# Patient Record
Sex: Female | Born: 1975 | Race: Black or African American | Hispanic: No | Marital: Single | Smoking: Current every day smoker
Health system: Southern US, Community
[De-identification: ages and names within clinical notes are randomized; demographics above are authoritative.]

## PROBLEM LIST (undated history)

## (undated) DIAGNOSIS — E049 Nontoxic goiter, unspecified: Secondary | ICD-10-CM

## (undated) DIAGNOSIS — E785 Hyperlipidemia, unspecified: Secondary | ICD-10-CM

## (undated) DIAGNOSIS — E119 Type 2 diabetes mellitus without complications: Secondary | ICD-10-CM

## (undated) DIAGNOSIS — K219 Gastro-esophageal reflux disease without esophagitis: Secondary | ICD-10-CM

## (undated) DIAGNOSIS — E569 Vitamin deficiency, unspecified: Secondary | ICD-10-CM

## (undated) HISTORY — PX: TUBAL LIGATION: SHX77

## (undated) HISTORY — PX: CERVICAL CERCLAGE: SHX1329

## (undated) HISTORY — PX: COLONOSCOPY: SHX174

---

## 2010-12-29 DIAGNOSIS — Z8742 Personal history of other diseases of the female genital tract: Secondary | ICD-10-CM | POA: Insufficient documentation

## 2010-12-29 DIAGNOSIS — E559 Vitamin D deficiency, unspecified: Secondary | ICD-10-CM | POA: Insufficient documentation

## 2010-12-29 DIAGNOSIS — E669 Obesity, unspecified: Secondary | ICD-10-CM | POA: Insufficient documentation

## 2010-12-29 DIAGNOSIS — E785 Hyperlipidemia, unspecified: Secondary | ICD-10-CM | POA: Insufficient documentation

## 2010-12-29 DIAGNOSIS — A6009 Herpesviral infection of other urogenital tract: Secondary | ICD-10-CM | POA: Insufficient documentation

## 2010-12-29 DIAGNOSIS — E042 Nontoxic multinodular goiter: Secondary | ICD-10-CM | POA: Insufficient documentation

## 2010-12-29 DIAGNOSIS — K219 Gastro-esophageal reflux disease without esophagitis: Secondary | ICD-10-CM | POA: Insufficient documentation

## 2010-12-29 DIAGNOSIS — Z8709 Personal history of other diseases of the respiratory system: Secondary | ICD-10-CM | POA: Insufficient documentation

## 2010-12-29 DIAGNOSIS — E1165 Type 2 diabetes mellitus with hyperglycemia: Secondary | ICD-10-CM | POA: Insufficient documentation

## 2014-02-23 ENCOUNTER — Emergency Department: Payer: Self-pay | Admitting: Internal Medicine

## 2015-07-14 DIAGNOSIS — Z720 Tobacco use: Secondary | ICD-10-CM | POA: Insufficient documentation

## 2015-11-13 DIAGNOSIS — E1165 Type 2 diabetes mellitus with hyperglycemia: Secondary | ICD-10-CM | POA: Diagnosis not present

## 2015-11-13 DIAGNOSIS — Z794 Long term (current) use of insulin: Secondary | ICD-10-CM | POA: Diagnosis not present

## 2015-11-17 DIAGNOSIS — E8881 Metabolic syndrome: Secondary | ICD-10-CM | POA: Diagnosis not present

## 2015-11-17 DIAGNOSIS — E785 Hyperlipidemia, unspecified: Secondary | ICD-10-CM | POA: Diagnosis not present

## 2015-11-17 DIAGNOSIS — E1165 Type 2 diabetes mellitus with hyperglycemia: Secondary | ICD-10-CM | POA: Diagnosis not present

## 2015-11-17 DIAGNOSIS — E559 Vitamin D deficiency, unspecified: Secondary | ICD-10-CM | POA: Diagnosis not present

## 2015-12-16 DIAGNOSIS — E559 Vitamin D deficiency, unspecified: Secondary | ICD-10-CM | POA: Diagnosis not present

## 2015-12-16 DIAGNOSIS — E785 Hyperlipidemia, unspecified: Secondary | ICD-10-CM | POA: Diagnosis not present

## 2015-12-16 DIAGNOSIS — E1165 Type 2 diabetes mellitus with hyperglycemia: Secondary | ICD-10-CM | POA: Diagnosis not present

## 2015-12-16 DIAGNOSIS — E8881 Metabolic syndrome: Secondary | ICD-10-CM | POA: Diagnosis not present

## 2016-03-02 DIAGNOSIS — Z23 Encounter for immunization: Secondary | ICD-10-CM | POA: Diagnosis not present

## 2016-04-20 DIAGNOSIS — E1165 Type 2 diabetes mellitus with hyperglycemia: Secondary | ICD-10-CM | POA: Diagnosis not present

## 2016-04-20 DIAGNOSIS — E785 Hyperlipidemia, unspecified: Secondary | ICD-10-CM | POA: Diagnosis not present

## 2016-04-20 DIAGNOSIS — E559 Vitamin D deficiency, unspecified: Secondary | ICD-10-CM | POA: Diagnosis not present

## 2016-04-20 DIAGNOSIS — Z794 Long term (current) use of insulin: Secondary | ICD-10-CM | POA: Diagnosis not present

## 2016-04-22 DIAGNOSIS — E1165 Type 2 diabetes mellitus with hyperglycemia: Secondary | ICD-10-CM | POA: Diagnosis not present

## 2016-04-22 DIAGNOSIS — E785 Hyperlipidemia, unspecified: Secondary | ICD-10-CM | POA: Diagnosis not present

## 2016-04-22 DIAGNOSIS — E8881 Metabolic syndrome: Secondary | ICD-10-CM | POA: Diagnosis not present

## 2016-04-22 DIAGNOSIS — E559 Vitamin D deficiency, unspecified: Secondary | ICD-10-CM | POA: Diagnosis not present

## 2016-09-23 DIAGNOSIS — Z23 Encounter for immunization: Secondary | ICD-10-CM | POA: Diagnosis not present

## 2016-09-23 DIAGNOSIS — E042 Nontoxic multinodular goiter: Secondary | ICD-10-CM | POA: Diagnosis not present

## 2016-09-23 DIAGNOSIS — Z794 Long term (current) use of insulin: Secondary | ICD-10-CM | POA: Diagnosis not present

## 2016-09-23 DIAGNOSIS — Z1231 Encounter for screening mammogram for malignant neoplasm of breast: Secondary | ICD-10-CM | POA: Diagnosis not present

## 2016-09-23 DIAGNOSIS — E1165 Type 2 diabetes mellitus with hyperglycemia: Secondary | ICD-10-CM | POA: Diagnosis not present

## 2016-09-23 DIAGNOSIS — Z0000 Encounter for general adult medical examination without abnormal findings: Secondary | ICD-10-CM | POA: Diagnosis not present

## 2016-11-08 ENCOUNTER — Other Ambulatory Visit: Payer: Self-pay | Admitting: Obstetrics & Gynecology

## 2016-11-08 DIAGNOSIS — Z01419 Encounter for gynecological examination (general) (routine) without abnormal findings: Secondary | ICD-10-CM | POA: Diagnosis not present

## 2016-11-08 DIAGNOSIS — Z1231 Encounter for screening mammogram for malignant neoplasm of breast: Secondary | ICD-10-CM

## 2016-11-08 DIAGNOSIS — Z87898 Personal history of other specified conditions: Secondary | ICD-10-CM | POA: Diagnosis not present

## 2017-01-04 DIAGNOSIS — Z23 Encounter for immunization: Secondary | ICD-10-CM | POA: Diagnosis not present

## 2017-01-04 DIAGNOSIS — J029 Acute pharyngitis, unspecified: Secondary | ICD-10-CM | POA: Diagnosis not present

## 2017-01-04 DIAGNOSIS — H6122 Impacted cerumen, left ear: Secondary | ICD-10-CM | POA: Diagnosis not present

## 2017-01-06 DIAGNOSIS — H6122 Impacted cerumen, left ear: Secondary | ICD-10-CM | POA: Diagnosis not present

## 2017-01-21 DIAGNOSIS — E1165 Type 2 diabetes mellitus with hyperglycemia: Secondary | ICD-10-CM | POA: Diagnosis not present

## 2017-01-21 DIAGNOSIS — Z794 Long term (current) use of insulin: Secondary | ICD-10-CM | POA: Diagnosis not present

## 2017-01-25 DIAGNOSIS — E785 Hyperlipidemia, unspecified: Secondary | ICD-10-CM | POA: Diagnosis not present

## 2017-01-25 DIAGNOSIS — E1165 Type 2 diabetes mellitus with hyperglycemia: Secondary | ICD-10-CM | POA: Diagnosis not present

## 2017-01-25 DIAGNOSIS — E8881 Metabolic syndrome: Secondary | ICD-10-CM | POA: Diagnosis not present

## 2017-01-25 DIAGNOSIS — E559 Vitamin D deficiency, unspecified: Secondary | ICD-10-CM | POA: Diagnosis not present

## 2017-02-25 DIAGNOSIS — E559 Vitamin D deficiency, unspecified: Secondary | ICD-10-CM | POA: Diagnosis not present

## 2017-02-25 DIAGNOSIS — E8881 Metabolic syndrome: Secondary | ICD-10-CM | POA: Diagnosis not present

## 2017-02-25 DIAGNOSIS — E1165 Type 2 diabetes mellitus with hyperglycemia: Secondary | ICD-10-CM | POA: Diagnosis not present

## 2017-02-25 DIAGNOSIS — E042 Nontoxic multinodular goiter: Secondary | ICD-10-CM | POA: Diagnosis not present

## 2017-03-03 DIAGNOSIS — R399 Unspecified symptoms and signs involving the genitourinary system: Secondary | ICD-10-CM | POA: Diagnosis not present

## 2017-03-03 DIAGNOSIS — Z789 Other specified health status: Secondary | ICD-10-CM | POA: Diagnosis not present

## 2017-03-03 DIAGNOSIS — Z23 Encounter for immunization: Secondary | ICD-10-CM | POA: Diagnosis not present

## 2017-05-13 DIAGNOSIS — R748 Abnormal levels of other serum enzymes: Secondary | ICD-10-CM | POA: Diagnosis not present

## 2017-05-13 DIAGNOSIS — E559 Vitamin D deficiency, unspecified: Secondary | ICD-10-CM | POA: Diagnosis not present

## 2017-05-13 DIAGNOSIS — R0789 Other chest pain: Secondary | ICD-10-CM | POA: Diagnosis not present

## 2017-05-13 DIAGNOSIS — E1165 Type 2 diabetes mellitus with hyperglycemia: Secondary | ICD-10-CM | POA: Diagnosis not present

## 2017-05-13 DIAGNOSIS — Z794 Long term (current) use of insulin: Secondary | ICD-10-CM | POA: Diagnosis not present

## 2017-05-13 DIAGNOSIS — R809 Proteinuria, unspecified: Secondary | ICD-10-CM | POA: Diagnosis not present

## 2017-05-19 DIAGNOSIS — Z720 Tobacco use: Secondary | ICD-10-CM | POA: Diagnosis not present

## 2017-05-19 DIAGNOSIS — E1165 Type 2 diabetes mellitus with hyperglycemia: Secondary | ICD-10-CM | POA: Diagnosis not present

## 2017-05-19 DIAGNOSIS — R079 Chest pain, unspecified: Secondary | ICD-10-CM | POA: Diagnosis not present

## 2017-05-19 DIAGNOSIS — E785 Hyperlipidemia, unspecified: Secondary | ICD-10-CM | POA: Diagnosis not present

## 2017-05-25 DIAGNOSIS — R748 Abnormal levels of other serum enzymes: Secondary | ICD-10-CM | POA: Diagnosis not present

## 2017-05-25 DIAGNOSIS — N1330 Unspecified hydronephrosis: Secondary | ICD-10-CM | POA: Diagnosis not present

## 2017-06-01 DIAGNOSIS — R079 Chest pain, unspecified: Secondary | ICD-10-CM | POA: Diagnosis not present

## 2017-06-03 DIAGNOSIS — E1169 Type 2 diabetes mellitus with other specified complication: Secondary | ICD-10-CM | POA: Diagnosis not present

## 2017-06-03 DIAGNOSIS — Z794 Long term (current) use of insulin: Secondary | ICD-10-CM | POA: Diagnosis not present

## 2017-06-03 DIAGNOSIS — Z720 Tobacco use: Secondary | ICD-10-CM | POA: Diagnosis not present

## 2017-06-03 DIAGNOSIS — E1165 Type 2 diabetes mellitus with hyperglycemia: Secondary | ICD-10-CM | POA: Diagnosis not present

## 2017-06-16 DIAGNOSIS — E785 Hyperlipidemia, unspecified: Secondary | ICD-10-CM | POA: Diagnosis not present

## 2017-06-16 DIAGNOSIS — Z720 Tobacco use: Secondary | ICD-10-CM | POA: Diagnosis not present

## 2017-06-16 DIAGNOSIS — E1165 Type 2 diabetes mellitus with hyperglycemia: Secondary | ICD-10-CM | POA: Diagnosis not present

## 2017-06-16 DIAGNOSIS — R079 Chest pain, unspecified: Secondary | ICD-10-CM | POA: Diagnosis not present

## 2017-06-23 DIAGNOSIS — J9801 Acute bronchospasm: Secondary | ICD-10-CM | POA: Diagnosis not present

## 2017-06-23 DIAGNOSIS — B009 Herpesviral infection, unspecified: Secondary | ICD-10-CM | POA: Diagnosis not present

## 2017-06-23 DIAGNOSIS — R05 Cough: Secondary | ICD-10-CM | POA: Diagnosis not present

## 2017-06-23 DIAGNOSIS — Z720 Tobacco use: Secondary | ICD-10-CM | POA: Diagnosis not present

## 2017-07-12 DIAGNOSIS — E1165 Type 2 diabetes mellitus with hyperglycemia: Secondary | ICD-10-CM | POA: Diagnosis not present

## 2017-07-12 DIAGNOSIS — M889 Osteitis deformans of unspecified bone: Secondary | ICD-10-CM | POA: Diagnosis not present

## 2017-07-12 DIAGNOSIS — Z794 Long term (current) use of insulin: Secondary | ICD-10-CM | POA: Diagnosis not present

## 2017-07-12 DIAGNOSIS — E042 Nontoxic multinodular goiter: Secondary | ICD-10-CM | POA: Diagnosis not present

## 2017-10-14 DIAGNOSIS — E1165 Type 2 diabetes mellitus with hyperglycemia: Secondary | ICD-10-CM | POA: Diagnosis not present

## 2017-10-21 DIAGNOSIS — E1169 Type 2 diabetes mellitus with other specified complication: Secondary | ICD-10-CM | POA: Diagnosis not present

## 2017-10-21 DIAGNOSIS — M889 Osteitis deformans of unspecified bone: Secondary | ICD-10-CM | POA: Diagnosis not present

## 2017-10-21 DIAGNOSIS — E1165 Type 2 diabetes mellitus with hyperglycemia: Secondary | ICD-10-CM | POA: Diagnosis not present

## 2017-10-21 DIAGNOSIS — E559 Vitamin D deficiency, unspecified: Secondary | ICD-10-CM | POA: Diagnosis not present

## 2017-10-25 DIAGNOSIS — E785 Hyperlipidemia, unspecified: Secondary | ICD-10-CM

## 2017-10-25 DIAGNOSIS — M889 Osteitis deformans of unspecified bone: Secondary | ICD-10-CM | POA: Insufficient documentation

## 2017-10-25 DIAGNOSIS — E1165 Type 2 diabetes mellitus with hyperglycemia: Secondary | ICD-10-CM | POA: Insufficient documentation

## 2017-10-25 DIAGNOSIS — Z794 Long term (current) use of insulin: Secondary | ICD-10-CM

## 2017-10-25 DIAGNOSIS — E1169 Type 2 diabetes mellitus with other specified complication: Secondary | ICD-10-CM | POA: Insufficient documentation

## 2018-02-10 DIAGNOSIS — Z794 Long term (current) use of insulin: Secondary | ICD-10-CM | POA: Diagnosis not present

## 2018-02-10 DIAGNOSIS — E1169 Type 2 diabetes mellitus with other specified complication: Secondary | ICD-10-CM | POA: Diagnosis not present

## 2018-02-10 DIAGNOSIS — E1165 Type 2 diabetes mellitus with hyperglycemia: Secondary | ICD-10-CM | POA: Diagnosis not present

## 2018-02-10 DIAGNOSIS — E559 Vitamin D deficiency, unspecified: Secondary | ICD-10-CM | POA: Diagnosis not present

## 2018-02-10 DIAGNOSIS — E785 Hyperlipidemia, unspecified: Secondary | ICD-10-CM | POA: Diagnosis not present

## 2018-02-17 DIAGNOSIS — M889 Osteitis deformans of unspecified bone: Secondary | ICD-10-CM | POA: Diagnosis not present

## 2018-02-17 DIAGNOSIS — E1165 Type 2 diabetes mellitus with hyperglycemia: Secondary | ICD-10-CM | POA: Diagnosis not present

## 2018-02-17 DIAGNOSIS — E1169 Type 2 diabetes mellitus with other specified complication: Secondary | ICD-10-CM | POA: Diagnosis not present

## 2018-02-17 DIAGNOSIS — E559 Vitamin D deficiency, unspecified: Secondary | ICD-10-CM | POA: Diagnosis not present

## 2018-03-10 DIAGNOSIS — E1165 Type 2 diabetes mellitus with hyperglycemia: Secondary | ICD-10-CM | POA: Diagnosis not present

## 2018-03-10 DIAGNOSIS — E042 Nontoxic multinodular goiter: Secondary | ICD-10-CM | POA: Diagnosis not present

## 2018-03-10 DIAGNOSIS — E1169 Type 2 diabetes mellitus with other specified complication: Secondary | ICD-10-CM | POA: Diagnosis not present

## 2018-03-10 DIAGNOSIS — M889 Osteitis deformans of unspecified bone: Secondary | ICD-10-CM | POA: Diagnosis not present

## 2018-04-04 ENCOUNTER — Other Ambulatory Visit: Payer: Self-pay | Admitting: *Deleted

## 2018-04-04 NOTE — Patient Outreach (Signed)
[facility]) Care Management  04/04/2018  Michele Clay 1976-01-08 [id number]   Telephone Screening Date referral received: 03/29/18 Initial outreach: 04/04/18 Insurance: Monia Pouch  Initial unsuccessful telephone call to patient's preferred number in order to complete screening for [employer] high risk referral;  no answer, unable to leave message as the voice mail box to [name]'s only contact number was full. Comorbidities include: long history of uncontrolled Type 2 DM- most recent Hgb A1C= 12.2% on 02/10/18, current tobacco user- smokes 1 PPD, Hyperlipidemia, Paget's Disease,  Goiter, Vit D Insufficiency Per the electronic medical records [name] most recently [facility] and she will return for follow up on 05/19/18. She last saw her primary care provider on 06/23/17 for symptoms of bronchospasm. The electronic medical record does not show any recent emergency departments visits or inpatient hospitalizations   Plan: This RNCM will attempt another outreach within 4 business days and will route unsuccessful outreach letter to [employer] Care Management clinical pool to be mailed to patient's home address.  Bary Richard RN,CCM,CDE [facility] Care Management Coordinator Office Phone [phone] Office Fax [phone]

## 2018-04-07 ENCOUNTER — Encounter: Payer: Self-pay | Admitting: *Deleted

## 2018-04-07 ENCOUNTER — Other Ambulatory Visit: Payer: Self-pay | Admitting: *Deleted

## 2018-04-07 DIAGNOSIS — M889 Osteitis deformans of unspecified bone: Secondary | ICD-10-CM | POA: Insufficient documentation

## 2018-04-07 NOTE — Patient Outreach (Addendum)
[facility]) Care Management  04/07/2018  Michele Clay 06-Jul-1975 [id number]   Telephone Screening Date referral received: 03/29/18 Initial outreach: 04/04/18 Insurance: Monia Pouch  Subjective:  Successful second outreach to [employer] to discuss [employer]'s chronic disease management program. She states she would like to participate in the program as she is struggling with her diabetes management. She says she struggles at times to afford her medications and would like to use a continuous glucose monitor but cannot afford the copay. She states she is adherent with her medications except she says she cannot tolerate 2 doses of Metformin as it is prescribed as it causes GI upset. She said she does take it a night but if she takes a dose during the day, even if she takes it with food,  she experiences GI distress. She says she sees her endocrinologist on a regular basis and her next appointment is 05/19/18.   Objective: Per the electronic medical records Ms. Douville most recently [facility] and she will return for follow up on 05/19/18. She last saw her primary care provider on 06/23/17 for symptoms of bronchospasm. The electronic medical record does not show any recent emergency departments visits or inpatient hospitalizations. Comorbidities include: long history of uncontrolled Type 2 DM- most recent Hgb A1C= 12.2% on 02/10/18, current tobacco user- smokes 1 PPD, Hyperlipidemia, Paget's Disease,  Goiter, Vit D Insufficiency  Assessment: [employer] member struggling with diabetes management as evidenced by elevated Hgb A1C /clinical inertia over the past few years. Ms. Sligh agrees to participate in the [employer] chronic disease management program.  Plan: Will refer patient to [employer] disease management program for Type 2 diabetes self management assistance.   Referral form completed and securely e-mailed to [facility] [employer].   Will close case to [facility] Care Management services.  Bary Richard RN,CCM,CDE [facility] Care Management Coordinator Office Phone [phone] Office Fax [phone]

## 2018-05-10 DIAGNOSIS — K648 Other hemorrhoids: Secondary | ICD-10-CM | POA: Diagnosis not present

## 2018-05-10 DIAGNOSIS — K219 Gastro-esophageal reflux disease without esophagitis: Secondary | ICD-10-CM | POA: Diagnosis not present

## 2018-05-10 DIAGNOSIS — K625 Hemorrhage of anus and rectum: Secondary | ICD-10-CM | POA: Diagnosis not present

## 2018-05-10 DIAGNOSIS — Z1239 Encounter for other screening for malignant neoplasm of breast: Secondary | ICD-10-CM | POA: Diagnosis not present

## 2018-05-18 DIAGNOSIS — H6981 Other specified disorders of Eustachian tube, right ear: Secondary | ICD-10-CM | POA: Diagnosis not present

## 2018-08-30 DIAGNOSIS — K648 Other hemorrhoids: Secondary | ICD-10-CM | POA: Diagnosis not present

## 2018-08-30 DIAGNOSIS — Z01812 Encounter for preprocedural laboratory examination: Secondary | ICD-10-CM | POA: Diagnosis not present

## 2018-08-30 DIAGNOSIS — K625 Hemorrhage of anus and rectum: Secondary | ICD-10-CM | POA: Diagnosis not present

## 2018-08-30 DIAGNOSIS — Z1159 Encounter for screening for other viral diseases: Secondary | ICD-10-CM | POA: Diagnosis not present

## 2018-09-01 DIAGNOSIS — K648 Other hemorrhoids: Secondary | ICD-10-CM | POA: Diagnosis not present

## 2018-09-01 DIAGNOSIS — K625 Hemorrhage of anus and rectum: Secondary | ICD-10-CM | POA: Diagnosis not present

## 2018-09-14 DIAGNOSIS — E1169 Type 2 diabetes mellitus with other specified complication: Secondary | ICD-10-CM | POA: Diagnosis not present

## 2018-09-14 DIAGNOSIS — E785 Hyperlipidemia, unspecified: Secondary | ICD-10-CM | POA: Diagnosis not present

## 2018-09-14 DIAGNOSIS — E1165 Type 2 diabetes mellitus with hyperglycemia: Secondary | ICD-10-CM | POA: Diagnosis not present

## 2018-09-14 DIAGNOSIS — Z794 Long term (current) use of insulin: Secondary | ICD-10-CM | POA: Diagnosis not present

## 2018-09-14 DIAGNOSIS — M889 Osteitis deformans of unspecified bone: Secondary | ICD-10-CM | POA: Diagnosis not present

## 2018-09-14 DIAGNOSIS — R5383 Other fatigue: Secondary | ICD-10-CM | POA: Diagnosis not present

## 2018-09-22 DIAGNOSIS — M889 Osteitis deformans of unspecified bone: Secondary | ICD-10-CM | POA: Diagnosis not present

## 2018-09-22 DIAGNOSIS — E042 Nontoxic multinodular goiter: Secondary | ICD-10-CM | POA: Diagnosis not present

## 2018-09-22 DIAGNOSIS — E1165 Type 2 diabetes mellitus with hyperglycemia: Secondary | ICD-10-CM | POA: Diagnosis not present

## 2018-09-22 DIAGNOSIS — E1169 Type 2 diabetes mellitus with other specified complication: Secondary | ICD-10-CM | POA: Diagnosis not present

## 2018-09-25 ENCOUNTER — Other Ambulatory Visit: Payer: Self-pay | Admitting: Surgery

## 2018-09-25 DIAGNOSIS — E1165 Type 2 diabetes mellitus with hyperglycemia: Secondary | ICD-10-CM

## 2018-09-25 DIAGNOSIS — M889 Osteitis deformans of unspecified bone: Secondary | ICD-10-CM

## 2018-09-25 DIAGNOSIS — Z794 Long term (current) use of insulin: Secondary | ICD-10-CM

## 2018-09-25 DIAGNOSIS — R748 Abnormal levels of other serum enzymes: Secondary | ICD-10-CM

## 2018-11-30 DIAGNOSIS — Z20828 Contact with and (suspected) exposure to other viral communicable diseases: Secondary | ICD-10-CM | POA: Diagnosis not present

## 2018-11-30 DIAGNOSIS — J069 Acute upper respiratory infection, unspecified: Secondary | ICD-10-CM | POA: Diagnosis not present

## 2018-12-01 DIAGNOSIS — Z20828 Contact with and (suspected) exposure to other viral communicable diseases: Secondary | ICD-10-CM | POA: Diagnosis not present

## 2018-12-01 DIAGNOSIS — J069 Acute upper respiratory infection, unspecified: Secondary | ICD-10-CM | POA: Diagnosis not present

## 2018-12-13 DIAGNOSIS — Z1231 Encounter for screening mammogram for malignant neoplasm of breast: Secondary | ICD-10-CM | POA: Diagnosis not present

## 2018-12-13 DIAGNOSIS — Z1239 Encounter for other screening for malignant neoplasm of breast: Secondary | ICD-10-CM | POA: Diagnosis not present

## 2018-12-18 DIAGNOSIS — N946 Dysmenorrhea, unspecified: Secondary | ICD-10-CM | POA: Diagnosis not present

## 2018-12-18 DIAGNOSIS — Z1151 Encounter for screening for human papillomavirus (HPV): Secondary | ICD-10-CM | POA: Diagnosis not present

## 2018-12-18 DIAGNOSIS — Z124 Encounter for screening for malignant neoplasm of cervix: Secondary | ICD-10-CM | POA: Diagnosis not present

## 2018-12-18 DIAGNOSIS — Z113 Encounter for screening for infections with a predominantly sexual mode of transmission: Secondary | ICD-10-CM | POA: Diagnosis not present

## 2018-12-18 DIAGNOSIS — R69 Illness, unspecified: Secondary | ICD-10-CM | POA: Diagnosis not present

## 2018-12-18 DIAGNOSIS — Z01419 Encounter for gynecological examination (general) (routine) without abnormal findings: Secondary | ICD-10-CM | POA: Diagnosis not present

## 2019-01-09 DIAGNOSIS — E1165 Type 2 diabetes mellitus with hyperglycemia: Secondary | ICD-10-CM | POA: Diagnosis not present

## 2019-01-09 DIAGNOSIS — Z794 Long term (current) use of insulin: Secondary | ICD-10-CM | POA: Diagnosis not present

## 2019-02-28 DIAGNOSIS — Z20828 Contact with and (suspected) exposure to other viral communicable diseases: Secondary | ICD-10-CM | POA: Diagnosis not present

## 2019-04-20 DIAGNOSIS — E042 Nontoxic multinodular goiter: Secondary | ICD-10-CM | POA: Diagnosis not present

## 2019-04-20 DIAGNOSIS — Z794 Long term (current) use of insulin: Secondary | ICD-10-CM | POA: Diagnosis not present

## 2019-04-20 DIAGNOSIS — E785 Hyperlipidemia, unspecified: Secondary | ICD-10-CM | POA: Diagnosis not present

## 2019-04-20 DIAGNOSIS — E1169 Type 2 diabetes mellitus with other specified complication: Secondary | ICD-10-CM | POA: Diagnosis not present

## 2019-04-20 DIAGNOSIS — E1165 Type 2 diabetes mellitus with hyperglycemia: Secondary | ICD-10-CM | POA: Diagnosis not present

## 2019-04-24 DIAGNOSIS — E1165 Type 2 diabetes mellitus with hyperglycemia: Secondary | ICD-10-CM | POA: Diagnosis not present

## 2019-04-24 DIAGNOSIS — E1169 Type 2 diabetes mellitus with other specified complication: Secondary | ICD-10-CM | POA: Diagnosis not present

## 2019-04-24 DIAGNOSIS — E042 Nontoxic multinodular goiter: Secondary | ICD-10-CM | POA: Diagnosis not present

## 2019-04-24 DIAGNOSIS — M889 Osteitis deformans of unspecified bone: Secondary | ICD-10-CM | POA: Diagnosis not present

## 2019-10-02 DIAGNOSIS — R748 Abnormal levels of other serum enzymes: Secondary | ICD-10-CM | POA: Diagnosis not present

## 2019-10-02 DIAGNOSIS — E559 Vitamin D deficiency, unspecified: Secondary | ICD-10-CM | POA: Diagnosis not present

## 2019-10-02 DIAGNOSIS — Z794 Long term (current) use of insulin: Secondary | ICD-10-CM | POA: Diagnosis not present

## 2019-10-02 DIAGNOSIS — E785 Hyperlipidemia, unspecified: Secondary | ICD-10-CM | POA: Diagnosis not present

## 2019-10-02 DIAGNOSIS — E1169 Type 2 diabetes mellitus with other specified complication: Secondary | ICD-10-CM | POA: Diagnosis not present

## 2019-10-02 DIAGNOSIS — M889 Osteitis deformans of unspecified bone: Secondary | ICD-10-CM | POA: Diagnosis not present

## 2019-10-02 DIAGNOSIS — E1165 Type 2 diabetes mellitus with hyperglycemia: Secondary | ICD-10-CM | POA: Diagnosis not present

## 2019-10-24 DIAGNOSIS — R1013 Epigastric pain: Secondary | ICD-10-CM | POA: Diagnosis not present

## 2019-10-24 DIAGNOSIS — K219 Gastro-esophageal reflux disease without esophagitis: Secondary | ICD-10-CM | POA: Diagnosis not present

## 2019-12-05 ENCOUNTER — Other Ambulatory Visit
Admission: RE | Admit: 2019-12-05 | Discharge: 2019-12-05 | Disposition: A | Discharge status: Home / Self Care | Payer: Managed Care, Other (non HMO) | Source: Ambulatory Visit | Attending: Gastroenterology | Admitting: Gastroenterology

## 2019-12-05 ENCOUNTER — Other Ambulatory Visit: Payer: Self-pay

## 2019-12-05 DIAGNOSIS — Z20822 Contact with and (suspected) exposure to covid-19: Secondary | ICD-10-CM | POA: Insufficient documentation

## 2019-12-05 DIAGNOSIS — Z01812 Encounter for preprocedural laboratory examination: Secondary | ICD-10-CM | POA: Diagnosis not present

## 2019-12-06 ENCOUNTER — Encounter: Payer: Self-pay | Admitting: *Deleted

## 2019-12-06 LAB — SARS CORONAVIRUS 2 (TAT 6-24 HRS): SARS Coronavirus 2: NEGATIVE

## 2019-12-07 ENCOUNTER — Encounter
Admission: RE | Disposition: A | Discharge status: Home / Self Care | Payer: Self-pay | Source: Home / Self Care | Attending: Gastroenterology

## 2019-12-07 ENCOUNTER — Encounter: Payer: Self-pay | Admitting: *Deleted

## 2019-12-07 ENCOUNTER — Other Ambulatory Visit: Payer: Self-pay

## 2019-12-07 ENCOUNTER — Ambulatory Visit
Admission: RE | Admit: 2019-12-07 | Discharge: 2019-12-07 | Disposition: A | Discharge status: Home / Self Care | Payer: 59 | Attending: Gastroenterology | Admitting: Gastroenterology

## 2019-12-07 ENCOUNTER — Ambulatory Visit: Payer: 59 | Admitting: Anesthesiology

## 2019-12-07 DIAGNOSIS — K2970 Gastritis, unspecified, without bleeding: Secondary | ICD-10-CM | POA: Diagnosis not present

## 2019-12-07 DIAGNOSIS — Z79899 Other long term (current) drug therapy: Secondary | ICD-10-CM | POA: Diagnosis not present

## 2019-12-07 DIAGNOSIS — K219 Gastro-esophageal reflux disease without esophagitis: Secondary | ICD-10-CM | POA: Diagnosis not present

## 2019-12-07 DIAGNOSIS — B3781 Candidal esophagitis: Secondary | ICD-10-CM | POA: Diagnosis not present

## 2019-12-07 DIAGNOSIS — E1165 Type 2 diabetes mellitus with hyperglycemia: Secondary | ICD-10-CM | POA: Diagnosis not present

## 2019-12-07 DIAGNOSIS — E785 Hyperlipidemia, unspecified: Secondary | ICD-10-CM | POA: Diagnosis not present

## 2019-12-07 DIAGNOSIS — K3189 Other diseases of stomach and duodenum: Secondary | ICD-10-CM | POA: Diagnosis not present

## 2019-12-07 DIAGNOSIS — Z794 Long term (current) use of insulin: Secondary | ICD-10-CM | POA: Diagnosis not present

## 2019-12-07 DIAGNOSIS — E119 Type 2 diabetes mellitus without complications: Secondary | ICD-10-CM | POA: Insufficient documentation

## 2019-12-07 DIAGNOSIS — K319 Disease of stomach and duodenum, unspecified: Secondary | ICD-10-CM | POA: Diagnosis not present

## 2019-12-07 DIAGNOSIS — R1013 Epigastric pain: Secondary | ICD-10-CM | POA: Insufficient documentation

## 2019-12-07 DIAGNOSIS — K2289 Other specified disease of esophagus: Secondary | ICD-10-CM | POA: Insufficient documentation

## 2019-12-07 HISTORY — DX: Gastro-esophageal reflux disease without esophagitis: K21.9

## 2019-12-07 HISTORY — DX: Type 2 diabetes mellitus without complications: E11.9

## 2019-12-07 HISTORY — DX: Hyperlipidemia, unspecified: E78.5

## 2019-12-07 HISTORY — DX: Vitamin deficiency, unspecified: E56.9

## 2019-12-07 HISTORY — PX: ESOPHAGOGASTRODUODENOSCOPY (EGD) WITH PROPOFOL: SHX5813

## 2019-12-07 HISTORY — DX: Nontoxic goiter, unspecified: E04.9

## 2019-12-07 LAB — POCT PREGNANCY, URINE: Preg Test, Ur: NEGATIVE

## 2019-12-07 LAB — GLUCOSE, CAPILLARY
Glucose-Capillary: 321 mg/dL — ABNORMAL HIGH (ref 70–99)
Glucose-Capillary: 358 mg/dL — ABNORMAL HIGH (ref 70–99)

## 2019-12-07 LAB — KOH PREP

## 2019-12-07 SURGERY — ESOPHAGOGASTRODUODENOSCOPY (EGD) WITH PROPOFOL
Anesthesia: General

## 2019-12-07 MED ORDER — PROPOFOL 10 MG/ML IV BOLUS
INTRAVENOUS | Status: AC
  Filled 2019-12-07: qty 20

## 2019-12-07 MED ORDER — PROPOFOL 10 MG/ML IV BOLUS
INTRAVENOUS | Status: DC | PRN
  Administered 2019-12-07: 80 mg via INTRAVENOUS
  Administered 2019-12-07: 40 mg via INTRAVENOUS
  Administered 2019-12-07: 30 mg via INTRAVENOUS

## 2019-12-07 MED ORDER — SODIUM CHLORIDE 0.9 % IV SOLN: INTRAVENOUS | Status: DC

## 2019-12-07 MED ORDER — INSULIN ASPART 100 UNIT/ML ~~LOC~~ SOLN
SUBCUTANEOUS | Status: AC
  Administered 2019-12-07: 18 [IU] via SUBCUTANEOUS
  Filled 2019-12-07: qty 1

## 2019-12-07 MED ORDER — LIDOCAINE HCL (CARDIAC) PF 100 MG/5ML IV SOSY
PREFILLED_SYRINGE | INTRAVENOUS | Status: DC | PRN
  Administered 2019-12-07: 50 mg via INTRAVENOUS

## 2019-12-07 MED ORDER — INSULIN ASPART 100 UNIT/ML ~~LOC~~ SOLN: 18.0000 [IU] | Freq: Once | SUBCUTANEOUS | Status: AC

## 2019-12-07 NOTE — Op Note (Signed)
[facility] Gastroenterology Patient Name: Michele Clay Procedure Date: 12/07/2019 9:59 AM MRN: [id number] Account #: [id number] Date of Birth: 06/26/1975 Admit Type: Outpatient Age: 44 Room: [facility] ENDO ROOM 3 Gender: Female Note Status: Finalized Procedure:             Upper GI endoscopy Indications:           Epigastric abdominal pain, Gastro-esophageal reflux                         disease Providers:             Andrey Farmer MD, MD Referring MD:          Ricardo Jericho (Referring MD) Medicines:             Monitored Anesthesia Care Complications:         No immediate complications. Procedure:             Pre-Anesthesia Assessment:                        - Prior to the procedure, a History and Physical was                         performed, and patient medications and allergies were                         reviewed. The patient is competent. The risks and                         benefits of the procedure and the sedation options and                         risks were discussed with the patient. All questions                         were answered and informed consent was obtained.                         Patient identification and proposed procedure were                         verified by the physician, the nurse, the anesthetist                         and the technician in the endoscopy suite. Mental                         Status Examination: alert and oriented. Airway                         Examination: normal oropharyngeal airway and neck                         mobility. Respiratory Examination: clear to                         auscultation. CV Examination: normal. Prophylactic                         Antibiotics:  The patient does not require prophylactic                         antibiotics. Prior Anticoagulants: The patient has                         taken no previous anticoagulant or antiplatelet                         agents. ASA Grade  Assessment: III - A patient with                         severe systemic disease. After reviewing the risks and                         benefits, the patient was deemed in satisfactory                         condition to undergo the procedure. The anesthesia                         plan was to use monitored anesthesia care (MAC).                         Immediately prior to administration of medications,                         the patient was re-assessed for adequacy to receive                         sedatives. The heart rate, respiratory rate, oxygen                         saturations, blood pressure, adequacy of pulmonary                         ventilation, and response to care were monitored                         throughout the procedure. The physical status of the                         patient was re-assessed after the procedure.                        After obtaining informed consent, the endoscope was                         passed under direct vision. Throughout the procedure,                         the patient's blood pressure, pulse, and oxygen                         saturations were monitored continuously. The Endoscope                         was introduced through the mouth, and advanced to the  second part of duodenum. The upper GI endoscopy was                         accomplished without difficulty. The patient tolerated                         the procedure well. Findings:      White nummular lesions were noted in the entire esophagus. Brushings for       KOH prep were obtained in the lower third of the esophagus.      The entire examined stomach was normal. Biopsies were taken with a cold       forceps for Helicobacter pylori testing. Estimated blood loss was       minimal.      The examined duodenum was normal. Impression:            - White nummular lesions in esophageal mucosa.                         Brushings performed.                         - Normal stomach. Biopsied.                        - Normal examined duodenum. Recommendation:        - Discharge patient to home.                        - Resume previous diet.                        - Continue present medications.                        - Await pathology results. Likely with candida                         esophagitis so will require treatment.                        - Return to referring physician as previously                         scheduled. Procedure Code(s):     --- Professional ---                        [phone], Esophagogastroduodenoscopy, flexible,                         transoral; with biopsy, single or multiple Diagnosis Code(s):     --- Professional ---                        K22.8, Other specified diseases of esophagus                        R10.13, Epigastric pain                        K21.9, Gastro-esophageal reflux disease without  esophagitis CPT copyright 2019 American Medical Association. All rights reserved. The codes documented in this report are preliminary and upon coder review may  be revised to meet current compliance requirements. Andrey Farmer, MD Andrey Farmer MD, MD 12/07/2019 10:31:32 AM Number of Addenda: 0 Note Initiated On: 12/07/2019 9:59 AM Estimated Blood Loss:  Estimated blood loss: none.      [facility]

## 2019-12-07 NOTE — Anesthesia Preprocedure Evaluation (Signed)
Anesthesia Evaluation  Patient identified by MRN, date of birth, ID band Patient awake    Reviewed: Allergy & Precautions, NPO status , Patient's Chart, lab work & pertinent test results  History of Anesthesia Complications Negative for: history of anesthetic complications  Airway Mallampati: II       Dental  (+) Upper Dentures, Chipped, Poor Dentition   Pulmonary neg sleep apnea, neg COPD, Current Smoker,           Cardiovascular (-) hypertension(-) Past MI and (-) CHF (-) dysrhythmias (-) Valvular Problems/Murmurs     Neuro/Psych neg Seizures    GI/Hepatic Neg liver ROS, GERD  Medicated and Controlled,  Endo/Other  diabetes, Type 2, Oral Hypoglycemic Agents, Insulin Dependent  Renal/GU negative Renal ROS     Musculoskeletal   Abdominal   Peds  Hematology   Anesthesia Other Findings   Reproductive/Obstetrics                            Anesthesia Physical Anesthesia Plan  ASA: III  Anesthesia Plan: General   Post-op Pain Management:    Induction: Intravenous  PONV Risk Score and Plan: 2 and Propofol infusion and TIVA  Airway Management Planned: Nasal Cannula  Additional Equipment:   Intra-op Plan:   Post-operative Plan:   Informed Consent: I have reviewed the patients History and Physical, chart, labs and discussed the procedure including the risks, benefits and alternatives for the proposed anesthesia with the patient or authorized representative who has indicated his/her understanding and acceptance.       Plan Discussed with:   Anesthesia Plan Comments:         Anesthesia Quick Evaluation

## 2019-12-07 NOTE — Progress Notes (Signed)
Blood glucose still elevated at 351. Dr. Henrene Hawking was notified of this. Patient will be discharged home and she can take her diabetes medications that she held this morning prior to the procedure.

## 2019-12-07 NOTE — Transfer of Care (Signed)
Immediate Anesthesia Transfer of Care Note  Patient: Michele Clay  Procedure(s) Performed: ESOPHAGOGASTRODUODENOSCOPY (EGD) WITH PROPOFOL (N/A )  Patient Location: Endoscopy Unit  Anesthesia Type:General  Level of Consciousness: drowsy and patient cooperative  Airway & Oxygen Therapy: Patient Spontanous Breathing  Post-op Assessment: Report given to RN and Post -op Vital signs reviewed and stable  Post vital signs: Reviewed and stable  Last Vitals:  Vitals Value Taken Time  BP 106/58 12/07/19 1036  Temp    Pulse 92 12/07/19 1036  Resp 20 12/07/19 1036  SpO2 98 % 12/07/19 1036  Vitals shown include unvalidated device data.  Last Pain:  Vitals:   12/07/19 0921  TempSrc: Temporal  PainSc: 0-No pain         Complications: No complications documented.

## 2019-12-07 NOTE — H&P (Signed)
Outpatient short stay form Pre-procedure 12/07/2019 10:03 AM Merlyn Lot MD, MPH  Primary Physician: NP White  Reason for visit:  Heartburn  History of present illness:   44 y/o lady with severe heart burn refractory to PPI BID. No blood thinners, abdominal surgeries, or family history of GI malignancies. Does takes NSAIDS around her cycle monthly.    Current Facility-Administered Medications:  .  0.9 %  sodium chloride infusion, , Intravenous, Continuous, Jaya Lapka, Rossie Muskrat, MD, Last Rate: 20 mL/hr at 12/07/19 0941, New Bag at 12/07/19 0941  Medications Prior to Admission  Medication Sig Dispense Refill Last Dose  . atorvastatin (LIPITOR) 20 MG tablet Take 20 mg by mouth daily.   12/04/19  . citalopram (CELEXA) 10 MG tablet Take 10 mg by mouth daily.   Past Month at Unknown time  . empagliflozin (JARDIANCE) 25 MG TABS tablet Take by mouth daily.   12/06/2019 at Unknown time  . gabapentin (NEURONTIN) 300 MG capsule Take 300 mg by mouth daily.   12/06/2019 at Unknown time  . ibuprofen (ADVIL) 200 MG tablet Take 200 mg by mouth every 6 (six) hours as needed.   11/07/19  . insulin aspart (NOVOLOG) 100 UNIT/ML injection Inject into the skin 3 (three) times daily before meals.   12/06/2019 at 2230  . insulin degludec (TRESIBA) 200 UNIT/ML FlexTouch Pen Inject into the skin daily.   12/06/2019 at 2230  . metFORMIN (GLUCOPHAGE) 500 MG tablet Take 500 mg by mouth daily.   12/06/2019 at Unknown time  . pantoprazole (PROTONIX) 40 MG tablet Take 40 mg by mouth daily.   12/06/2019 at Unknown time  . valACYclovir (VALTREX) 500 MG tablet Take 500 mg by mouth daily.     . Vitamin D, Ergocalciferol, (DRISDOL) 1.25 MG (50000 UNIT) CAPS capsule Take by mouth.   12/06/2019 at Unknown time  . calcium carbonate (OS-CAL) 1250 (500 Ca) MG chewable tablet Chew 1 tablet by mouth 2 (two) times daily. (Patient not taking: Reported on 12/07/2019)   Not Taking at Unknown time  . omeprazole (PRILOSEC) 40 MG  capsule Take 40 mg by mouth daily. (Patient not taking: Reported on 12/07/2019)   Not Taking at Unknown time     Not on File   Past Medical History:  Diagnosis Date  . Diabetes mellitus without complication (HCC)   . GERD (gastroesophageal reflux disease)   . Goiter   . Hyperlipidemia   . Vitamin deficiency     Review of systems:  Otherwise negative.    Physical Exam  Gen: Alert, oriented. Appears stated age.  HEENT: [place]/AT. PERRLA. Lungs: No respiratory distress Abd: soft, benign, no masses. BS+ Ext: No edema. Pulses 2+    Planned procedures: Proceed with EGD. The patient understands the nature of the planned procedure, indications, risks, alternatives and potential complications including but not limited to bleeding, infection, perforation, damage to internal organs and possible oversedation/side effects from anesthesia. The patient agrees and gives consent to proceed.  Please refer to procedure notes for findings, recommendations and patient disposition/instructions.     Merlyn Lot MD, MPH Gastroenterology 12/07/2019  10:03 AM

## 2019-12-07 NOTE — Interval H&P Note (Signed)
History and Physical Interval Note:  12/07/2019 10:05 AM  Michele Clay  has presented today for surgery, with the diagnosis of GERD EPIG PAIN.  The various methods of treatment have been discussed with the patient and family. After consideration of risks, benefits and other options for treatment, the patient has consented to  Procedure(s): ESOPHAGOGASTRODUODENOSCOPY (EGD) WITH PROPOFOL (N/A) as a surgical intervention.  The patient's history has been reviewed, patient examined, no change in status, stable for surgery.  I have reviewed the patient's chart and labs.  Questions were answered to the patient's satisfaction.     Regis Bill  Ok to proceed with EGD.

## 2019-12-07 NOTE — Anesthesia Postprocedure Evaluation (Signed)
Anesthesia Post Note  Patient: Michele Clay  Procedure(s) Performed: ESOPHAGOGASTRODUODENOSCOPY (EGD) WITH PROPOFOL (N/A )  Patient location during evaluation: Endoscopy Anesthesia Type: General Level of consciousness: awake and alert Pain management: pain level controlled Vital Signs Assessment: post-procedure vital signs reviewed and stable Respiratory status: spontaneous breathing and respiratory function stable Cardiovascular status: stable Anesthetic complications: no   No complications documented.   Last Vitals:  Vitals:   12/07/19 1034 12/07/19 1036  BP: 106/82 (!) 106/58  Pulse: 91   Resp:  18  Temp:    SpO2: 98% 98%    Last Pain:  Vitals:   12/07/19 1034  TempSrc:   PainSc: 0-No pain                 Murad Staples K

## 2019-12-10 ENCOUNTER — Encounter: Payer: Self-pay | Admitting: Gastroenterology

## 2019-12-10 LAB — SURGICAL PATHOLOGY

## 2020-01-01 DIAGNOSIS — Z124 Encounter for screening for malignant neoplasm of cervix: Secondary | ICD-10-CM | POA: Diagnosis not present

## 2020-01-01 DIAGNOSIS — Z01419 Encounter for gynecological examination (general) (routine) without abnormal findings: Secondary | ICD-10-CM | POA: Diagnosis not present

## 2020-01-01 DIAGNOSIS — Z113 Encounter for screening for infections with a predominantly sexual mode of transmission: Secondary | ICD-10-CM | POA: Diagnosis not present

## 2020-01-01 DIAGNOSIS — Z1231 Encounter for screening mammogram for malignant neoplasm of breast: Secondary | ICD-10-CM | POA: Diagnosis not present

## 2020-01-01 DIAGNOSIS — R69 Illness, unspecified: Secondary | ICD-10-CM | POA: Diagnosis not present

## 2020-01-01 DIAGNOSIS — Z114 Encounter for screening for human immunodeficiency virus [HIV]: Secondary | ICD-10-CM | POA: Diagnosis not present

## 2020-01-02 ENCOUNTER — Other Ambulatory Visit: Payer: Self-pay

## 2020-01-11 DIAGNOSIS — E559 Vitamin D deficiency, unspecified: Secondary | ICD-10-CM | POA: Diagnosis not present

## 2020-01-11 DIAGNOSIS — E1165 Type 2 diabetes mellitus with hyperglycemia: Secondary | ICD-10-CM | POA: Diagnosis not present

## 2020-01-11 DIAGNOSIS — Z794 Long term (current) use of insulin: Secondary | ICD-10-CM | POA: Diagnosis not present

## 2020-01-11 DIAGNOSIS — E1169 Type 2 diabetes mellitus with other specified complication: Secondary | ICD-10-CM | POA: Diagnosis not present

## 2020-01-11 DIAGNOSIS — R748 Abnormal levels of other serum enzymes: Secondary | ICD-10-CM | POA: Diagnosis not present

## 2020-01-25 ENCOUNTER — Other Ambulatory Visit: Payer: Self-pay

## 2020-01-25 DIAGNOSIS — Z1231 Encounter for screening mammogram for malignant neoplasm of breast: Secondary | ICD-10-CM

## 2020-02-13 ENCOUNTER — Encounter: Payer: Managed Care, Other (non HMO) | Admitting: Dietician

## 2020-02-13 ENCOUNTER — Encounter: Payer: Self-pay | Admitting: Dietician

## 2020-02-13 ENCOUNTER — Other Ambulatory Visit: Payer: Self-pay

## 2020-02-13 ENCOUNTER — Ambulatory Visit: Payer: Managed Care, Other (non HMO) | Admitting: Dietician

## 2020-02-13 ENCOUNTER — Encounter: Payer: Managed Care, Other (non HMO) | Attending: Surgery | Admitting: Dietician

## 2020-02-13 VITALS — Ht 65.0 in | Wt 162.0 lb

## 2020-02-13 DIAGNOSIS — E119 Type 2 diabetes mellitus without complications: Secondary | ICD-10-CM | POA: Diagnosis not present

## 2020-02-13 NOTE — Progress Notes (Signed)
Medical Nutrition Therapy: Visit start time: 1100  end time: 1200  Assessment:  Diagnosis: type 2 diabetes Past medical history: hyperlipidemia, vitamin D deficiency, GERD, goiter Psychosocial issues/ stress concerns: moderate-high stress level, mostly work related. Takes citalopram as needed   Current weight: approx. 162lbs per patient report Height: 5'5" Medications, supplements: reconciled list in medical record  Progress and evaluation:   Patient reports diabetes diagnosis around 2004, she states she has required insulin since soon after diagnosis; she reports she does have some endogenous insulin.  BGs have been elevated for the past 2 years, with HbA1C readings ranging from 11.8- >14%. Most recent reading was 12.2% on 01/11/20. She is taking long and short-acting insulins + Jardiance and 500mg  Metformin daily.   She reports inconsistency in checking BGs and knows she needs to improve. Her work schedule and demands have interfered with this and other self care habits.   She reports some weight loss with high BGs, she feels best when she is about 170lbs.   She had some diabetes education soon after diagnosis, but none recently. She has forgotten how to count carbs.  Patient reports feeling hungry and relies on carbs to feel satisfied.  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 Dining out frequency: 0-1 meals per week, about 3x per month.  Breakfast: drinks coffee; at 11am-12 -- 2 eggs with bacon or sausage, bread or biscuit; leftovers Snack: chips/ fruit/ nuts/ crackers with cheese Lunch: often none, just has snacks (but prefers having meals) Snack: none Supper: meat + starch + veg; had spaghetti 02/11/20 Snack: cookies or similar food; sometimes none Beverages: water, coffee in am, Sunkist zero, occasional sugar sweetened or lightly sweetened tea (3x a  week)  Nutrition Care Education: Topics covered:  Basic nutrition: basic food groups, appropriate nutrient balance, appropriate meal and snack schedule, general nutrition guidelines    Diabetes:  appropriate meal and snack schedule, appropriate carb intake and balance, healthy carb choices, role of fiber, protein, fat; plate method for basic meal planning; balanced meal and snack options; basic carb counting; role of physical activity, safety with high BGs Hyperlipidemia:  Importance of limiting  unhealthy fats   Nutritional Diagnosis:  [place]-2.2 Altered nutrition-related laboratory As related to Type 2 diabetes.  As evidenced by patient with recent HbA1C of 12.2%.  Intervention:   Instruction and discussion as noted above.  Patient voices understanding of need to prioritize her self care to avoid serious complications from diabetes. She has set goals to improve BG monitoring and dietary monitoring, as well as decrease/ control portions of carbohydrate foods.   Will further discuss carb counting at follow-up visit.  Education Materials given: (sent via email)  02/13/20 with food lists  Sample menus  Snacking handout    Learner/ who was taught:   Patient    Level of understanding:  Verbalizes/ demonstrates competency   Demonstrated degree of understanding via:   Teach back Learning barriers:  None  Willingness to learn/ readiness for change:  Eager, change in progress   Monitoring and Evaluation:  Dietary intake, exercise, BG control, and body weight      follow up: 03/28/20 at 8:15am,

## 2020-02-13 NOTE — Patient Instructions (Signed)
   Keep food diary -- record what is eaten and the amounts.   Use food lists, food labels, and online information (try calorieking.com) to get carb amounts for foods.  Test blood sugars fasting and after meals as instructed by MD and record.

## 2020-03-28 ENCOUNTER — Ambulatory Visit: Payer: Managed Care, Other (non HMO) | Admitting: Dietician

## 2020-03-28 DIAGNOSIS — Z20828 Contact with and (suspected) exposure to other viral communicable diseases: Secondary | ICD-10-CM | POA: Diagnosis not present

## 2020-04-30 ENCOUNTER — Ambulatory Visit: Payer: Managed Care, Other (non HMO) | Admitting: Dietician

## 2020-06-13 DIAGNOSIS — Z720 Tobacco use: Secondary | ICD-10-CM | POA: Diagnosis not present

## 2020-06-13 DIAGNOSIS — E042 Nontoxic multinodular goiter: Secondary | ICD-10-CM | POA: Diagnosis not present

## 2020-06-13 DIAGNOSIS — E1165 Type 2 diabetes mellitus with hyperglycemia: Secondary | ICD-10-CM | POA: Diagnosis not present

## 2020-06-13 DIAGNOSIS — Z794 Long term (current) use of insulin: Secondary | ICD-10-CM | POA: Diagnosis not present

## 2020-06-13 DIAGNOSIS — E1169 Type 2 diabetes mellitus with other specified complication: Secondary | ICD-10-CM | POA: Diagnosis not present

## 2020-06-13 DIAGNOSIS — E559 Vitamin D deficiency, unspecified: Secondary | ICD-10-CM | POA: Diagnosis not present

## 2020-06-19 DIAGNOSIS — R7989 Other specified abnormal findings of blood chemistry: Secondary | ICD-10-CM | POA: Diagnosis not present

## 2020-06-19 DIAGNOSIS — E785 Hyperlipidemia, unspecified: Secondary | ICD-10-CM | POA: Diagnosis not present

## 2020-06-19 DIAGNOSIS — Z720 Tobacco use: Secondary | ICD-10-CM | POA: Diagnosis not present

## 2020-06-19 DIAGNOSIS — E1169 Type 2 diabetes mellitus with other specified complication: Secondary | ICD-10-CM | POA: Diagnosis not present

## 2020-06-19 DIAGNOSIS — E559 Vitamin D deficiency, unspecified: Secondary | ICD-10-CM | POA: Diagnosis not present

## 2020-06-20 ENCOUNTER — Encounter: Payer: Self-pay | Admitting: Dietician

## 2020-06-20 NOTE — Progress Notes (Signed)
Have not heard back from patient to reschedule her cancelled appointment from 04/30/20. Sent notification to referring provider.

## 2020-07-25 DIAGNOSIS — R7989 Other specified abnormal findings of blood chemistry: Secondary | ICD-10-CM | POA: Diagnosis not present

## 2020-07-31 ENCOUNTER — Other Ambulatory Visit: Payer: Self-pay | Admitting: Gastroenterology

## 2020-07-31 DIAGNOSIS — R1013 Epigastric pain: Secondary | ICD-10-CM

## 2020-08-01 DIAGNOSIS — E042 Nontoxic multinodular goiter: Secondary | ICD-10-CM | POA: Diagnosis not present

## 2020-08-05 DIAGNOSIS — N6489 Other specified disorders of breast: Secondary | ICD-10-CM | POA: Diagnosis not present

## 2020-08-05 DIAGNOSIS — Z1231 Encounter for screening mammogram for malignant neoplasm of breast: Secondary | ICD-10-CM | POA: Diagnosis not present

## 2020-08-19 DIAGNOSIS — R928 Other abnormal and inconclusive findings on diagnostic imaging of breast: Secondary | ICD-10-CM | POA: Diagnosis not present

## 2020-08-19 DIAGNOSIS — N6489 Other specified disorders of breast: Secondary | ICD-10-CM | POA: Diagnosis not present

## 2020-08-29 ENCOUNTER — Ambulatory Visit: Payer: 59

## 2020-09-10 ENCOUNTER — Other Ambulatory Visit: Payer: Self-pay | Admitting: Gastroenterology

## 2020-09-10 DIAGNOSIS — R197 Diarrhea, unspecified: Secondary | ICD-10-CM | POA: Diagnosis not present

## 2020-09-10 DIAGNOSIS — R6881 Early satiety: Secondary | ICD-10-CM | POA: Diagnosis not present

## 2020-09-10 DIAGNOSIS — R1013 Epigastric pain: Secondary | ICD-10-CM | POA: Diagnosis not present

## 2020-09-11 ENCOUNTER — Other Ambulatory Visit: Payer: Self-pay | Admitting: Gastroenterology

## 2020-09-11 DIAGNOSIS — R6881 Early satiety: Secondary | ICD-10-CM

## 2020-09-11 DIAGNOSIS — R1013 Epigastric pain: Secondary | ICD-10-CM

## 2020-09-22 ENCOUNTER — Ambulatory Visit
Admission: RE | Admit: 2020-09-22 | Discharge: 2020-09-22 | Disposition: A | Discharge status: Home / Self Care | Payer: 59 | Source: Ambulatory Visit | Attending: Gastroenterology | Admitting: Gastroenterology

## 2020-09-22 ENCOUNTER — Other Ambulatory Visit: Payer: Self-pay

## 2020-09-22 DIAGNOSIS — K30 Functional dyspepsia: Secondary | ICD-10-CM | POA: Diagnosis not present

## 2020-09-22 DIAGNOSIS — R1013 Epigastric pain: Secondary | ICD-10-CM | POA: Diagnosis not present

## 2020-09-22 IMAGING — US US ABDOMEN LIMITED
1 series · 14 of 25 positions shown · non-contrast
Comparison: None.

CLINICAL DATA: Chronic dyspepsia

EXAM:
ULTRASOUND ABDOMEN LIMITED RIGHT UPPER QUADRANT

[Series 1: us abdomen limited · 0.25mm/px · 14 of 49 slices shown]
[im 1/49]
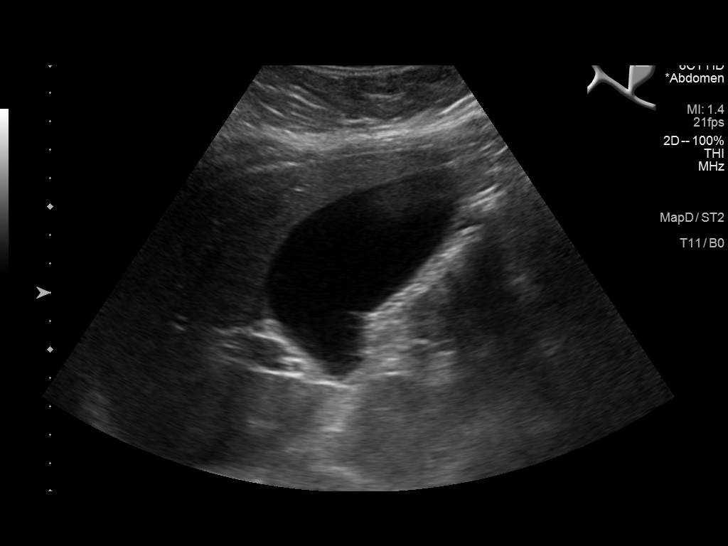
[im 5/49]
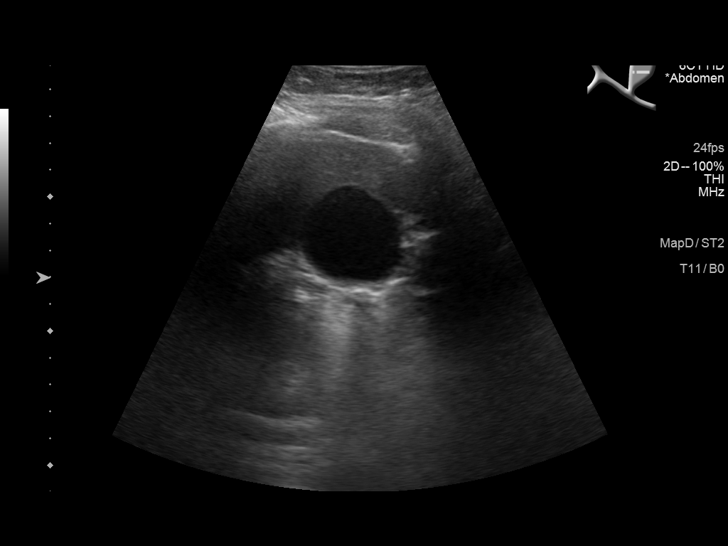
[im 9/49]
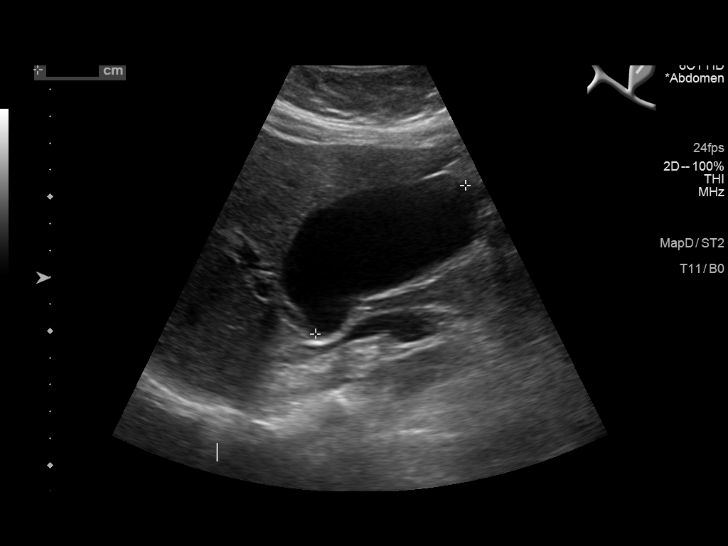
[im 13/49]
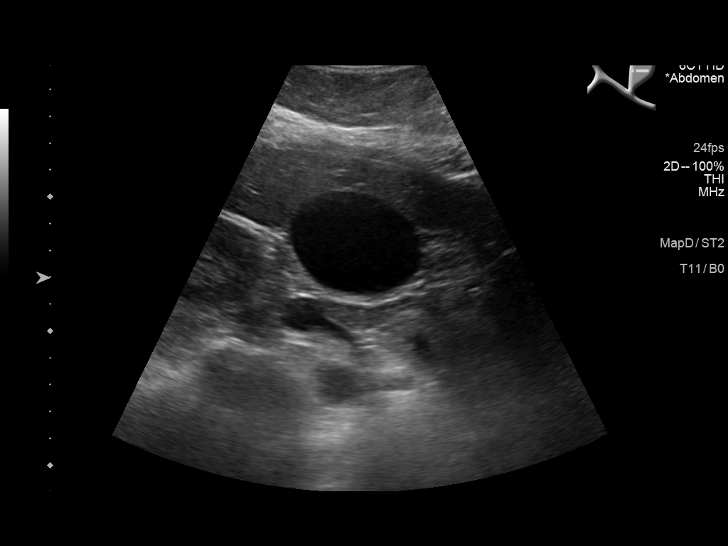
[im 17/49]
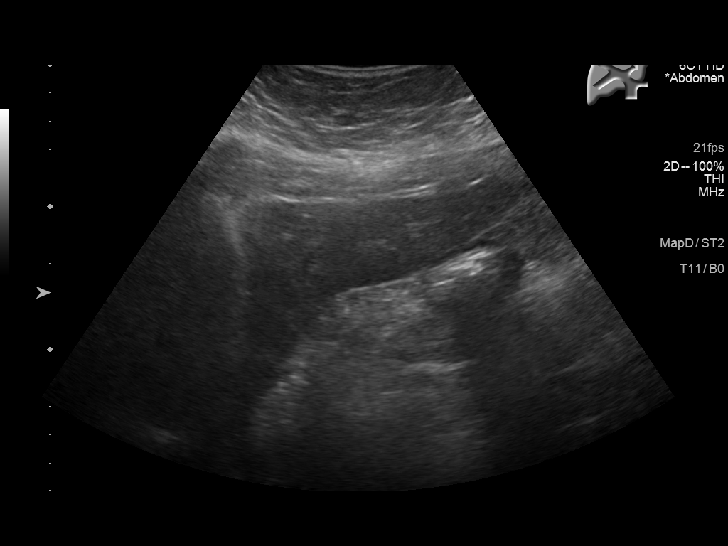
[im 19/49]
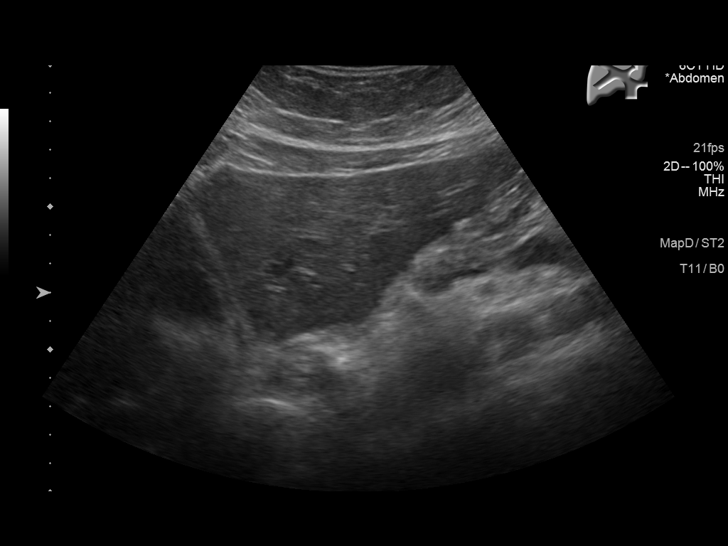
[im 23/49]
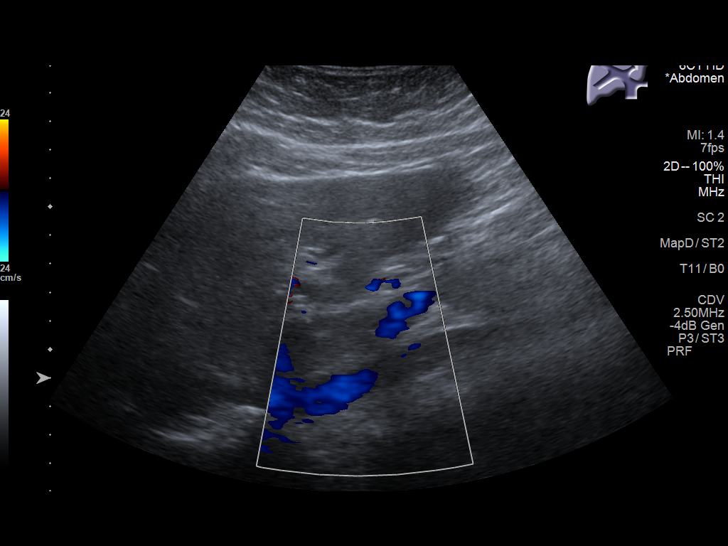
[im 27/49]
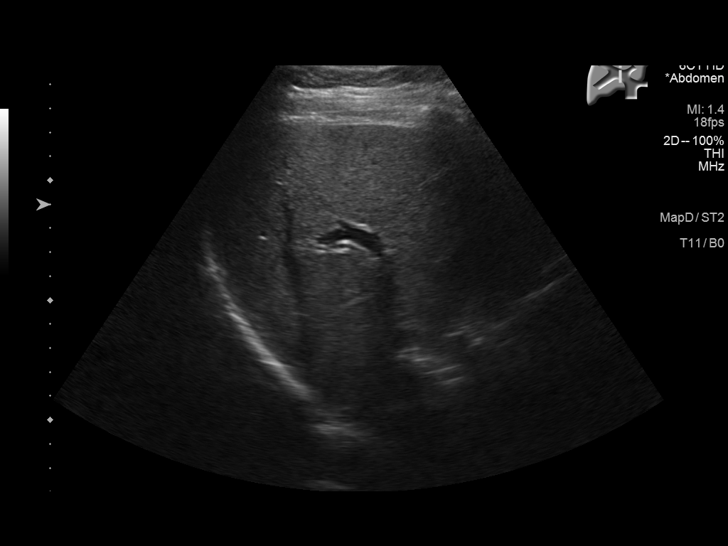
[im 31/49]
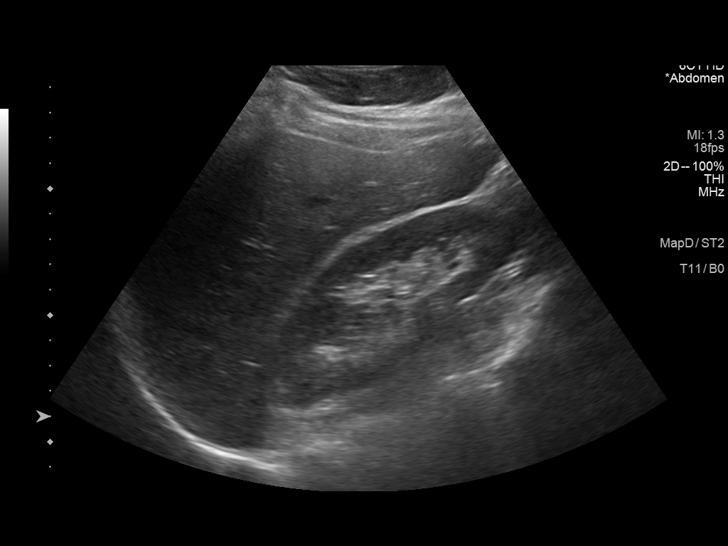
[im 33/49]
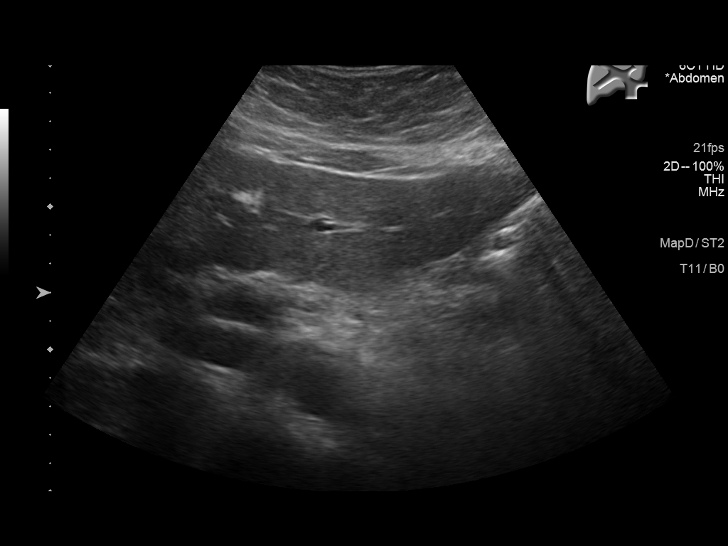
[im 37/49]
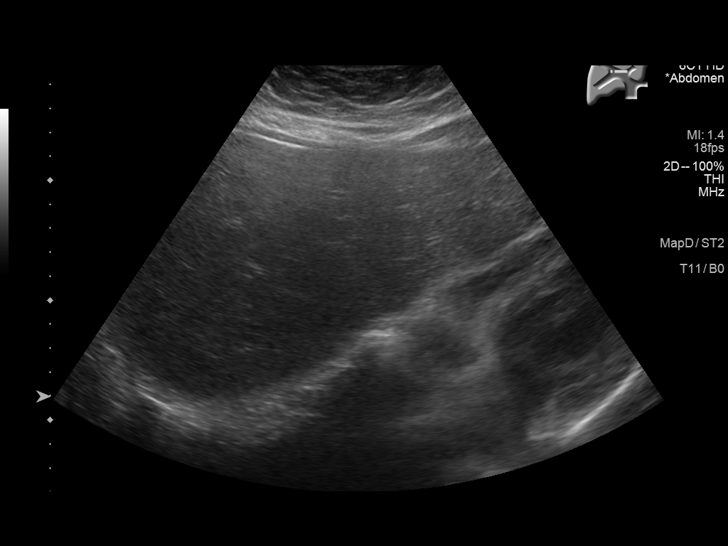
[im 41/49]
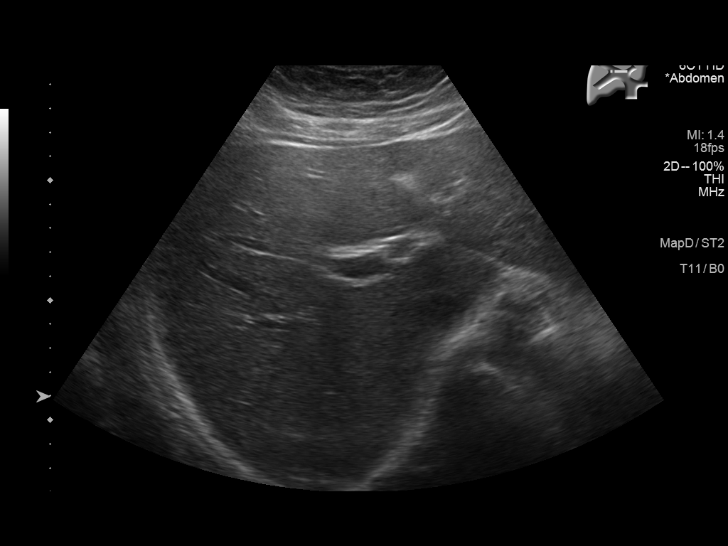
[im 45/49]
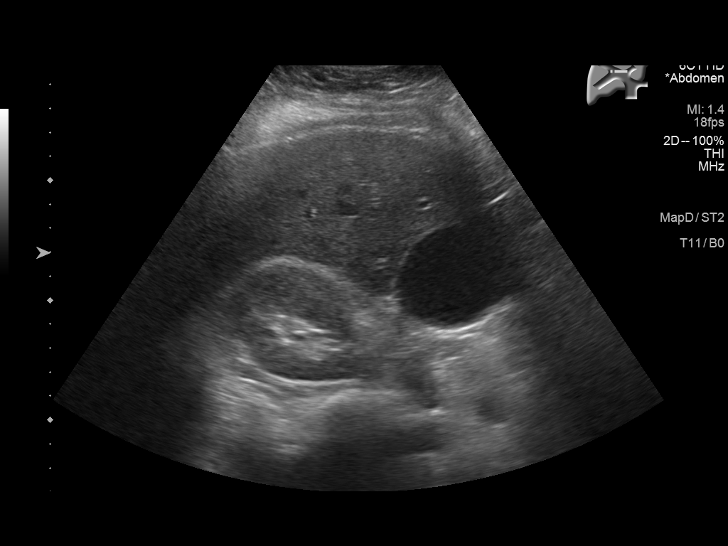
[im 49/49]
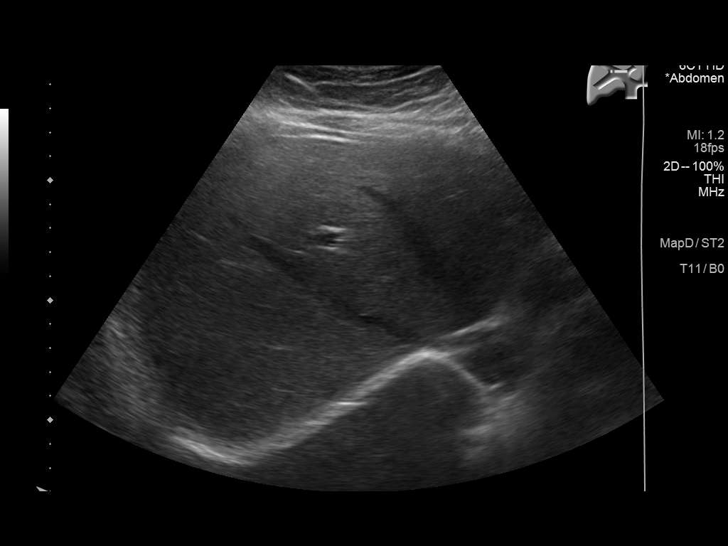

[14 of 25 positions shown; findings below may reference images not displayed]

FINDINGS: Gallbladder:

No gallstones or wall thickening visualized. No sonographic Murphy
sign noted by sonographer.

Common bile duct:

Diameter: 3 mm

Liver:

No focal lesion identified. Within normal limits in parenchymal
echogenicity. Portal vein is patent on color Doppler imaging with
normal direction of blood flow towards the liver.

Other: None.
IMPRESSION: Unremarkable right upper quadrant ultrasound.

## 2020-10-17 ENCOUNTER — Other Ambulatory Visit: Payer: Self-pay

## 2020-10-17 ENCOUNTER — Ambulatory Visit
Admission: RE | Admit: 2020-10-17 | Discharge: 2020-10-17 | Disposition: A | Discharge status: Home / Self Care | Payer: 59 | Source: Ambulatory Visit | Attending: Gastroenterology | Admitting: Gastroenterology

## 2020-10-17 DIAGNOSIS — R6881 Early satiety: Secondary | ICD-10-CM | POA: Diagnosis not present

## 2020-10-17 DIAGNOSIS — R1013 Epigastric pain: Secondary | ICD-10-CM

## 2020-10-17 DIAGNOSIS — K30 Functional dyspepsia: Secondary | ICD-10-CM | POA: Diagnosis not present

## 2020-10-17 IMAGING — NM NM GASTRIC EMPTYING
6 series · 20 of 20 positions shown · non-contrast
Comparison: Ultrasound 09/22/2020

CLINICAL DATA: Early satiety

EXAM:
NUCLEAR MEDICINE GASTRIC EMPTYING SCAN
TECHNIQUE: After oral ingestion of radiolabeled meal, sequential abdominal
images were obtained for 4 hours. Percentage of activity emptying
the stomach was calculated at 1 hour, 2 hour, 3 hour, and 4 hours.
RADIOPHARMACEUTICALS:  2.4 mCi Oc-NNm sulfur colloid in standardized
meal

[Series 1000: gastric statics · 3.90mm/px · 2 of 2 frames shown (1 of 5)]
[frame 1/2]
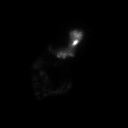
[frame 2/2]
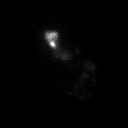

[Series 1000: gastric statics · 3.90mm/px · 2 of 2 frames shown (2 of 5)]
[frame 1/2]
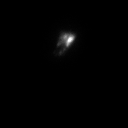
[frame 2/2]
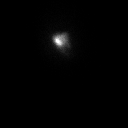

[Series 1000: gastric statics · 3.90mm/px · 2 of 2 frames shown (3 of 5)]
[frame 1/2]
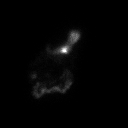
[frame 2/2]
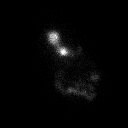

[Series 1000: gastric statics (results) · 3.90mm/px · 5 acquisitions, 10 frames shown]
[im 1/5]
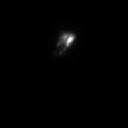
[im 1/5]
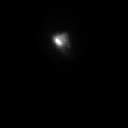
[im 2/5]
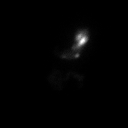
[im 2/5]
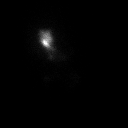
[im 3/5]
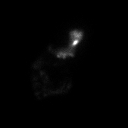
[im 3/5]
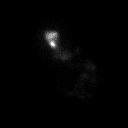
[im 4/5]
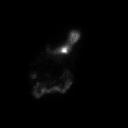
[im 4/5]
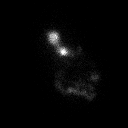
[im 5/5]
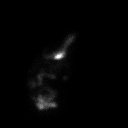
[im 5/5]
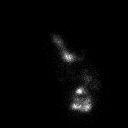

[Series 1000: gastric statics · 3.90mm/px · 2 of 2 frames shown (4 of 5)]
[frame 1/2]
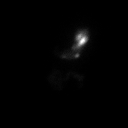
[frame 2/2]
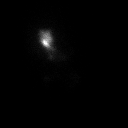

[Series 1000: gastric statics · 3.90mm/px · 2 of 2 frames shown (5 of 5)]
[frame 1/2]
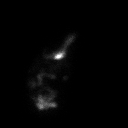
[frame 2/2]
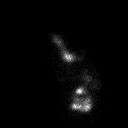

[20 of 20 positions shown; findings below may reference images not displayed]

FINDINGS: Expected location of the stomach in the left upper quadrant.
Ingested meal empties the stomach gradually over the course of the
study.

5% emptied at 1 hr ( normal >= 10%)

28% emptied at 2 hr ( normal >= 40%)

47% emptied at 3 hr ( normal >= 70%)

69% emptied at 4 hr ( normal >= 90%)
IMPRESSION: Delayed gastric emptying study.

## 2020-10-17 MED ORDER — TECHNETIUM TC 99M SULFUR COLLOID
2.0000 | Freq: Once | INTRAVENOUS | Status: AC | PRN
  Administered 2020-10-17: 2.4 via ORAL

## 2020-11-10 DIAGNOSIS — E1169 Type 2 diabetes mellitus with other specified complication: Secondary | ICD-10-CM | POA: Diagnosis not present

## 2020-11-10 DIAGNOSIS — E1142 Type 2 diabetes mellitus with diabetic polyneuropathy: Secondary | ICD-10-CM | POA: Diagnosis not present

## 2020-11-10 DIAGNOSIS — E1165 Type 2 diabetes mellitus with hyperglycemia: Secondary | ICD-10-CM | POA: Diagnosis not present

## 2020-11-10 DIAGNOSIS — E1143 Type 2 diabetes mellitus with diabetic autonomic (poly)neuropathy: Secondary | ICD-10-CM | POA: Diagnosis not present

## 2020-11-10 DIAGNOSIS — E559 Vitamin D deficiency, unspecified: Secondary | ICD-10-CM | POA: Diagnosis not present

## 2020-11-10 DIAGNOSIS — Z794 Long term (current) use of insulin: Secondary | ICD-10-CM | POA: Diagnosis not present

## 2020-11-10 DIAGNOSIS — K5900 Constipation, unspecified: Secondary | ICD-10-CM | POA: Diagnosis not present

## 2020-11-10 DIAGNOSIS — K219 Gastro-esophageal reflux disease without esophagitis: Secondary | ICD-10-CM | POA: Diagnosis not present

## 2020-11-10 DIAGNOSIS — Z720 Tobacco use: Secondary | ICD-10-CM | POA: Diagnosis not present

## 2021-01-12 DIAGNOSIS — E1143 Type 2 diabetes mellitus with diabetic autonomic (poly)neuropathy: Secondary | ICD-10-CM | POA: Diagnosis not present

## 2021-01-12 DIAGNOSIS — K3184 Gastroparesis: Secondary | ICD-10-CM | POA: Diagnosis not present

## 2021-01-12 DIAGNOSIS — K5909 Other constipation: Secondary | ICD-10-CM | POA: Diagnosis not present

## 2021-01-12 DIAGNOSIS — K219 Gastro-esophageal reflux disease without esophagitis: Secondary | ICD-10-CM | POA: Diagnosis not present

## 2021-02-06 DIAGNOSIS — E113393 Type 2 diabetes mellitus with moderate nonproliferative diabetic retinopathy without macular edema, bilateral: Secondary | ICD-10-CM | POA: Diagnosis not present

## 2021-02-20 DIAGNOSIS — B009 Herpesviral infection, unspecified: Secondary | ICD-10-CM | POA: Diagnosis not present

## 2021-02-20 DIAGNOSIS — Z0000 Encounter for general adult medical examination without abnormal findings: Secondary | ICD-10-CM | POA: Diagnosis not present

## 2021-02-20 DIAGNOSIS — E1169 Type 2 diabetes mellitus with other specified complication: Secondary | ICD-10-CM | POA: Diagnosis not present

## 2021-02-20 DIAGNOSIS — D229 Melanocytic nevi, unspecified: Secondary | ICD-10-CM | POA: Diagnosis not present

## 2021-02-20 DIAGNOSIS — E785 Hyperlipidemia, unspecified: Secondary | ICD-10-CM | POA: Diagnosis not present

## 2021-02-20 DIAGNOSIS — Z124 Encounter for screening for malignant neoplasm of cervix: Secondary | ICD-10-CM | POA: Diagnosis not present

## 2021-02-20 DIAGNOSIS — Z8619 Personal history of other infectious and parasitic diseases: Secondary | ICD-10-CM | POA: Diagnosis not present

## 2021-04-15 DIAGNOSIS — E1143 Type 2 diabetes mellitus with diabetic autonomic (poly)neuropathy: Secondary | ICD-10-CM | POA: Diagnosis not present

## 2021-04-15 DIAGNOSIS — K5909 Other constipation: Secondary | ICD-10-CM | POA: Diagnosis not present

## 2021-04-15 DIAGNOSIS — K3184 Gastroparesis: Secondary | ICD-10-CM | POA: Diagnosis not present

## 2021-04-15 DIAGNOSIS — K219 Gastro-esophageal reflux disease without esophagitis: Secondary | ICD-10-CM | POA: Diagnosis not present

## 2021-04-24 DIAGNOSIS — Z720 Tobacco use: Secondary | ICD-10-CM | POA: Diagnosis not present

## 2021-04-24 DIAGNOSIS — E1169 Type 2 diabetes mellitus with other specified complication: Secondary | ICD-10-CM | POA: Diagnosis not present

## 2021-04-24 DIAGNOSIS — E559 Vitamin D deficiency, unspecified: Secondary | ICD-10-CM | POA: Diagnosis not present

## 2021-04-24 DIAGNOSIS — Z794 Long term (current) use of insulin: Secondary | ICD-10-CM | POA: Diagnosis not present

## 2021-04-24 DIAGNOSIS — E1142 Type 2 diabetes mellitus with diabetic polyneuropathy: Secondary | ICD-10-CM | POA: Diagnosis not present

## 2022-04-03 DIAGNOSIS — R92323 Mammographic fibroglandular density, bilateral breasts: Secondary | ICD-10-CM | POA: Diagnosis not present

## 2022-04-03 DIAGNOSIS — Z1231 Encounter for screening mammogram for malignant neoplasm of breast: Secondary | ICD-10-CM | POA: Diagnosis not present

## 2022-04-06 DIAGNOSIS — Z794 Long term (current) use of insulin: Secondary | ICD-10-CM | POA: Diagnosis not present

## 2022-04-06 DIAGNOSIS — E1169 Type 2 diabetes mellitus with other specified complication: Secondary | ICD-10-CM | POA: Diagnosis not present

## 2022-04-06 DIAGNOSIS — E785 Hyperlipidemia, unspecified: Secondary | ICD-10-CM | POA: Diagnosis not present

## 2022-04-06 DIAGNOSIS — E1165 Type 2 diabetes mellitus with hyperglycemia: Secondary | ICD-10-CM | POA: Diagnosis not present

## 2022-04-06 DIAGNOSIS — E559 Vitamin D deficiency, unspecified: Secondary | ICD-10-CM | POA: Diagnosis not present

## 2022-04-14 DIAGNOSIS — K219 Gastro-esophageal reflux disease without esophagitis: Secondary | ICD-10-CM | POA: Diagnosis not present

## 2022-04-14 DIAGNOSIS — K5909 Other constipation: Secondary | ICD-10-CM | POA: Diagnosis not present

## 2022-05-28 DIAGNOSIS — E559 Vitamin D deficiency, unspecified: Secondary | ICD-10-CM | POA: Diagnosis not present

## 2022-05-28 DIAGNOSIS — E1169 Type 2 diabetes mellitus with other specified complication: Secondary | ICD-10-CM | POA: Diagnosis not present

## 2022-05-28 DIAGNOSIS — E1165 Type 2 diabetes mellitus with hyperglycemia: Secondary | ICD-10-CM | POA: Diagnosis not present

## 2022-05-28 DIAGNOSIS — E785 Hyperlipidemia, unspecified: Secondary | ICD-10-CM | POA: Diagnosis not present

## 2022-05-28 DIAGNOSIS — Z794 Long term (current) use of insulin: Secondary | ICD-10-CM | POA: Diagnosis not present

## 2022-09-20 DIAGNOSIS — E785 Hyperlipidemia, unspecified: Secondary | ICD-10-CM | POA: Diagnosis not present

## 2022-09-20 DIAGNOSIS — E559 Vitamin D deficiency, unspecified: Secondary | ICD-10-CM | POA: Diagnosis not present

## 2022-09-20 DIAGNOSIS — E1169 Type 2 diabetes mellitus with other specified complication: Secondary | ICD-10-CM | POA: Diagnosis not present

## 2022-09-20 DIAGNOSIS — E1165 Type 2 diabetes mellitus with hyperglycemia: Secondary | ICD-10-CM | POA: Diagnosis not present

## 2022-09-20 DIAGNOSIS — Z794 Long term (current) use of insulin: Secondary | ICD-10-CM | POA: Diagnosis not present

## 2022-09-27 DIAGNOSIS — E559 Vitamin D deficiency, unspecified: Secondary | ICD-10-CM | POA: Diagnosis not present

## 2022-09-27 DIAGNOSIS — Z794 Long term (current) use of insulin: Secondary | ICD-10-CM | POA: Diagnosis not present

## 2022-09-27 DIAGNOSIS — E1169 Type 2 diabetes mellitus with other specified complication: Secondary | ICD-10-CM | POA: Diagnosis not present

## 2022-09-27 DIAGNOSIS — E785 Hyperlipidemia, unspecified: Secondary | ICD-10-CM | POA: Diagnosis not present

## 2022-09-27 DIAGNOSIS — E1165 Type 2 diabetes mellitus with hyperglycemia: Secondary | ICD-10-CM | POA: Diagnosis not present

## 2022-12-03 DIAGNOSIS — Z1331 Encounter for screening for depression: Secondary | ICD-10-CM | POA: Diagnosis not present

## 2022-12-03 DIAGNOSIS — J301 Allergic rhinitis due to pollen: Secondary | ICD-10-CM | POA: Diagnosis not present

## 2022-12-03 DIAGNOSIS — E1165 Type 2 diabetes mellitus with hyperglycemia: Secondary | ICD-10-CM | POA: Diagnosis not present

## 2023-01-11 DIAGNOSIS — R053 Chronic cough: Secondary | ICD-10-CM | POA: Diagnosis not present

## 2023-01-11 DIAGNOSIS — K2100 Gastro-esophageal reflux disease with esophagitis, without bleeding: Secondary | ICD-10-CM | POA: Diagnosis not present

## 2023-01-11 DIAGNOSIS — J9801 Acute bronchospasm: Secondary | ICD-10-CM | POA: Diagnosis not present

## 2023-03-21 ENCOUNTER — Ambulatory Visit (INDEPENDENT_AMBULATORY_CARE_PROVIDER_SITE_OTHER): Payer: BLUE CROSS/BLUE SHIELD | Admitting: Nurse Practitioner

## 2023-03-21 ENCOUNTER — Telehealth: Payer: Self-pay | Admitting: Nurse Practitioner

## 2023-03-21 ENCOUNTER — Encounter: Payer: Self-pay | Admitting: Nurse Practitioner

## 2023-03-21 VITALS — BP 100/80 | HR 93 | Temp 98.4°F | Resp 16 | Ht 66.0 in | Wt 165.4 lb

## 2023-03-21 DIAGNOSIS — Z308 Encounter for other contraceptive management: Secondary | ICD-10-CM

## 2023-03-21 DIAGNOSIS — K219 Gastro-esophageal reflux disease without esophagitis: Secondary | ICD-10-CM

## 2023-03-21 DIAGNOSIS — I152 Hypertension secondary to endocrine disorders: Secondary | ICD-10-CM

## 2023-03-21 DIAGNOSIS — K30 Functional dyspepsia: Secondary | ICD-10-CM

## 2023-03-21 DIAGNOSIS — E785 Hyperlipidemia, unspecified: Secondary | ICD-10-CM

## 2023-03-21 DIAGNOSIS — E1159 Type 2 diabetes mellitus with other circulatory complications: Secondary | ICD-10-CM | POA: Diagnosis not present

## 2023-03-21 DIAGNOSIS — E1169 Type 2 diabetes mellitus with other specified complication: Secondary | ICD-10-CM | POA: Diagnosis not present

## 2023-03-21 DIAGNOSIS — Z794 Long term (current) use of insulin: Secondary | ICD-10-CM

## 2023-03-21 NOTE — Telephone Encounter (Addendum)
MR request faxed to [facility]; [phone]  Records in Care Everywhere for [facility]

## 2023-03-21 NOTE — Progress Notes (Signed)
[facility] [address] [place], [place] [address]  Internal MEDICINE  Office Visit Note  Patient Name: Michele Clay  [id number]  [id number]  Date of Service: 03/21/2023   Complaints/HPI Pt is here for establishment of PCP. Chief Complaint  Patient presents with   New Patient (Initial Visit)    Est care. Needs pap smear     HPI Michele Clay presents for a new patient visit to establish care.  Well-appearing 48 y.o. female with GERD, diabetes, high cholesterol, elevated alk phos, low vitamin D, and multinodular goiter.  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  Home: lives with 92 yo daughter  Diet: pretty well balanced diet   Exercise: started doing exercise recently -- walking, biking sometimes, stretching Tobacco use: smoking cigarettes, was at 1ppd, down to 1/2 ppd.  Alcohol use: once or twice a week, 1 or 2 drinks  Illicit drug use: none  Routine CRC screening: last colonoscopy was ok. Was done a couple of years ago,  Routine mammogram: scheduled with [facility] of February 7 next month, has famiyl history of breast cancer.  Pap smear: wants to see OBGYN [facility] exam: [facility],  foot exam: done with endocrinology.  Labs: has had recent labs with endocrinology.  New or worsening pain: none    Current Medication: Outpatient Encounter Medications as of 03/21/2023  Medication Sig Note   atorvastatin (LIPITOR) 20 MG tablet Take 20 mg by mouth daily.    empagliflozin (JARDIANCE) 25 MG TABS tablet Take by mouth daily.    insulin degludec (TRESIBA) 200 UNIT/ML FlexTouch Pen Inject 100 Units into the skin daily. 12/07/2019: 92 units taken    insulin lispro (HUMALOG) 100 UNIT/ML KwikPen Inject 25 to 35 units under the skin three times daily before meals as directed by your doctor. Maximum daily dose is 105 units. Appointment needed for further refills [phone].    pantoprazole (PROTONIX) 40 MG tablet Take 40 mg by mouth daily.    valACYclovir (VALTREX) 500  MG tablet Take 500 mg by mouth daily.    Vitamin D, Ergocalciferol, (DRISDOL) 1.25 MG (50000 UNIT) CAPS capsule Take by mouth.    [DISCONTINUED] insulin aspart (NOVOLOG) 100 UNIT/ML injection Inject into the skin 3 (three) times daily before meals. 12/07/2019: Pt took 16 units   citalopram (CELEXA) 10 MG tablet Take 10 mg by mouth daily.    Continuous Glucose Sensor (DEXCOM G7 SENSOR) MISC USE 1 EACH EVERY 10 (TEN) DAYS    fluconazole (DIFLUCAN) 200 MG tablet Take by mouth.    lisinopril (ZESTRIL) 2.5 MG tablet Take 2.5 mg by mouth daily.    [DISCONTINUED] calcium carbonate (OS-CAL) 1250 (500 Ca) MG chewable tablet Chew 1 tablet by mouth 2 (two) times daily. (Patient not taking: Reported on 12/07/2019)    [DISCONTINUED] Continuous Blood Gluc Receiver (FREESTYLE LIBRE 14 DAY READER) DEVI Use 1 Device as needed .  Use to test blood sugar (Patient not taking: Reported on 03/21/2023)    [DISCONTINUED] gabapentin (NEURONTIN) 300 MG capsule Take 300 mg by mouth daily. (Patient not taking: Reported on 03/21/2023)    [DISCONTINUED] ibuprofen (ADVIL) 200 MG tablet Take 200 mg by mouth every 6 (six) hours as needed. (Patient not taking: Reported on 03/21/2023)    [DISCONTINUED] metFORMIN (GLUCOPHAGE) 500 MG tablet Take 500 mg by mouth daily. (Patient not taking: Reported on 03/21/2023)    [DISCONTINUED] omeprazole (PRILOSEC) 40 MG capsule Take 40 mg by mouth daily. (Patient not taking: Reported on 03/21/2023)    No  facility-administered encounter medications on file as of 03/21/2023.    Surgical History: Past Surgical History:  Procedure Laterality Date   CERVICAL CERCLAGE     COLONOSCOPY     ESOPHAGOGASTRODUODENOSCOPY (EGD) WITH PROPOFOL N/A 12/07/2019   Procedure: ESOPHAGOGASTRODUODENOSCOPY (EGD) WITH PROPOFOL;  Surgeon: Regis Bill, MD;  Location: [facility] ENDOSCOPY;  Service: Endoscopy;  Laterality: N/A;   TUBAL LIGATION      Medical History: Past Medical History:  Diagnosis Date   Diabetes  mellitus without complication (HCC)    GERD (gastroesophageal reflux disease)    Goiter    Hyperlipidemia    Vitamin deficiency     Family History: Family History  Problem Relation Age of Onset   Diabetes Mother    Hypertension Mother    Vitamin D deficiency Mother    Breast cancer Mother    Hypertension Father    Diabetes Father    Breast cancer Maternal Grandmother     Social History   Socioeconomic History   Marital status: Single    Spouse name: Not on file   Number of children: Not on file   Years of education: Not on file   Highest education level: Not on file  Occupational History   Not on file  Tobacco Use   Smoking status: Every Day    Current packs/day: 1.00    Average packs/day: 1 pack/day for 9.0 years (9.0 ttl pk-yrs)    Types: Cigarettes   Smokeless tobacco: Never  Vaping Use   Vaping status: Former  Substance and Sexual Activity   Alcohol use: Yes    Alcohol/week: 3.0 standard drinks of alcohol    Types: 3 Shots of liquor per week    Comment: occ.   Drug use: Never   Sexual activity: Not on file  Other Topics Concern   Not on file  Social History Narrative   Not on file   Social Drivers of Health   Financial Resource Strain: Not on file  Food Insecurity: Not on file  Transportation Needs: Not on file  Physical Activity: Not on file  Stress: Not on file  Social Connections: Not on file  Intimate Partner Violence: Not on file     Review of Systems  Constitutional:  Negative for chills, fatigue and unexpected weight change.  HENT:  Negative for congestion, postnasal drip, rhinorrhea, sneezing and sore throat.   Eyes:  Negative for redness.  Respiratory:  Negative for cough, chest tightness, shortness of breath and wheezing.   Cardiovascular: Negative.  Negative for chest pain and palpitations.  Gastrointestinal:  Positive for abdominal distention. Negative for abdominal pain, constipation, diarrhea, nausea and vomiting.       Delayed  gastric emptying  Genitourinary:  Negative for dysuria and frequency.  Musculoskeletal:  Negative for arthralgias, back pain, joint swelling and neck pain.  Skin:  Negative for rash.  Neurological: Negative.  Negative for tremors and numbness.  Hematological:  Negative for adenopathy. Does not bruise/bleed easily.  Psychiatric/Behavioral:  Negative for behavioral problems (Depression), sleep disturbance and suicidal ideas. The patient is not nervous/anxious.     Vital Signs: BP 100/80   Pulse 93   Temp 98.4 F (36.9 C)   Resp 16   Ht 5\' 6"  (1.676 m)   Wt 165 lb 6.4 oz (75 kg)   SpO2 98%   BMI 26.70 kg/m    Physical Exam Vitals reviewed.  Constitutional:      General: She is not in acute distress.    Appearance:  Normal appearance. She is not ill-appearing.  HENT:     Head: Normocephalic and atraumatic.  Eyes:     Pupils: Pupils are equal, round, and reactive to light.  Cardiovascular:     Rate and Rhythm: Normal rate and regular rhythm.  Pulmonary:     Effort: Pulmonary effort is normal. No respiratory distress.  Neurological:     Mental Status: She is alert and oriented to person, place, and time.  Psychiatric:        Mood and Affect: Mood normal.        Behavior: Behavior normal.       Assessment/Plan: 1. Type 2 diabetes mellitus with other specified complication, with long-term current use of insulin (HCC) (Primary) Sees endocrinology, taking jardiance, tresiba and humalog.   3. Hyperlipidemia associated with type 2 diabetes mellitus (HCC) Continue atorvastatin as prescribed.   4. Delayed gastric emptying Referred to [facility] gastroenterology - Ambulatory referral to Gastroenterology  5. Gastroesophageal reflux disease without esophagitis Taking pantoprazole, continue as prescribed.   6. Encounter for post Essure sterilization check Referred to [facility] in [place] per patient request  - Ambulatory referral to Obstetrics / Gynecology    General  Counseling: Michele Clay verbalizes understanding of the findings of todays visit and agrees with plan of treatment. I have discussed any further diagnostic evaluation that may be needed or ordered today. We also reviewed her medications today. she has been encouraged to call the office with any questions or concerns that should arise related to todays visit.    Orders Placed This Encounter  Procedures   Ambulatory referral to Obstetrics / Gynecology   Ambulatory referral to Gastroenterology    No orders of the defined types were placed in this encounter.   Return for CPE, Adynn Caseres PCP at earliest available opening patient preference. .  Time spent:30 Minutes Time spent with patient included reviewing progress notes, labs, imaging studies, and discussing plan for follow up.   [place] Controlled Substance Database was reviewed by me for overdose risk score (ORS)   This patient was seen by Sallyanne Kuster, FNP-C in collaboration with Dr. Beverely Risen as a part of collaborative care agreement.   Michele Clay R. Tedd Sias, MSN, FNP-C Internal Medicine

## 2023-03-22 ENCOUNTER — Telehealth: Payer: Self-pay | Admitting: Nurse Practitioner

## 2023-03-22 NOTE — Telephone Encounter (Signed)
Gastroenterology referral faxed to [facility] due to insurance; [phone] Notified patient. Gave pt telephone [phone]

## 2023-03-22 NOTE — Telephone Encounter (Signed)
GYN referral faxed to [facility] due to insurance; [phone] Notified patient. Gave pt telephone [phone]

## 2023-04-06 ENCOUNTER — Encounter: Payer: Self-pay | Admitting: Nurse Practitioner

## 2023-04-25 ENCOUNTER — Telehealth: Payer: Self-pay | Admitting: Nurse Practitioner

## 2023-04-25 NOTE — Telephone Encounter (Signed)
 Left vm and sent mychart message to confirm 05/02/23 appointment-Toni

## 2023-05-02 ENCOUNTER — Encounter: Payer: BLUE CROSS/BLUE SHIELD | Admitting: Nurse Practitioner

## 2023-05-03 ENCOUNTER — Encounter: Payer: Self-pay | Admitting: Nurse Practitioner

## 2023-05-03 ENCOUNTER — Ambulatory Visit (INDEPENDENT_AMBULATORY_CARE_PROVIDER_SITE_OTHER): Admitting: Nurse Practitioner

## 2023-05-03 VITALS — BP 110/78 | HR 102 | Temp 98.4°F | Resp 16 | Ht 66.0 in | Wt 165.6 lb

## 2023-05-03 DIAGNOSIS — M79642 Pain in left hand: Secondary | ICD-10-CM

## 2023-05-03 DIAGNOSIS — E559 Vitamin D deficiency, unspecified: Secondary | ICD-10-CM | POA: Diagnosis not present

## 2023-05-03 DIAGNOSIS — Z1211 Encounter for screening for malignant neoplasm of colon: Secondary | ICD-10-CM

## 2023-05-03 DIAGNOSIS — K219 Gastro-esophageal reflux disease without esophagitis: Secondary | ICD-10-CM | POA: Diagnosis not present

## 2023-05-03 DIAGNOSIS — Z1212 Encounter for screening for malignant neoplasm of rectum: Secondary | ICD-10-CM

## 2023-05-03 DIAGNOSIS — G8929 Other chronic pain: Secondary | ICD-10-CM

## 2023-05-03 DIAGNOSIS — Z0001 Encounter for general adult medical examination with abnormal findings: Secondary | ICD-10-CM

## 2023-05-03 MED ORDER — PANTOPRAZOLE SODIUM 40 MG PO TBEC: 40.0000 mg | DELAYED_RELEASE_TABLET | Freq: Every day | ORAL | 1 refills | Status: DC

## 2023-05-03 NOTE — Progress Notes (Signed)
 [facility] [address] [place], [place] [address]  Internal MEDICINE  Office Visit Note  Patient Name: Michele Clay  [id number]  [id number]  Date of Service: 05/03/2023  Chief Complaint  Patient presents with   Diabetes   Gastroesophageal Reflux   Hyperlipidemia   Annual Exam    HPI Michele Clay presents for an annual well visit and physical exam.  Well-appearing 48 y.o. female with GERD, diabetes, high cholesterol, goiter, and low vitamin D Routine CRC screening: opt for cologuard  Routine mammogram: mammogram done recently and was normal  DEXA scan: will do this next year, has family history of osteoporosis.  Pap smear: done via OBGYN Eye exam and/or foot exam: Labs: labs are up to date, gets these done through endocrinology  New or worsening pain: pain of left hand still present after several months. She had hit her hand on a metal bed frame.  Other concerns: Has a flight schedule in June for a trip but is canceling it due to increased anxiety. She has very high anxiety. Need letter for canceling flight.    Current Medication: Outpatient Encounter Medications as of 05/03/2023  Medication Sig Note   atorvastatin (LIPITOR) 20 MG tablet Take 20 mg by mouth daily.    Continuous Glucose Sensor (DEXCOM G7 SENSOR) MISC USE 1 EACH EVERY 10 (TEN) DAYS    empagliflozin (JARDIANCE) 25 MG TABS tablet Take by mouth daily.    fluconazole (DIFLUCAN) 200 MG tablet Take by mouth.    insulin degludec (TRESIBA) 200 UNIT/ML FlexTouch Pen Inject 100 Units into the skin daily. 12/07/2019: 92 units taken    insulin lispro (HUMALOG) 100 UNIT/ML KwikPen Inject 25 to 35 units under the skin three times daily before meals as directed by your doctor. Maximum daily dose is 105 units. Appointment needed for further refills [phone].    lisinopril (ZESTRIL) 2.5 MG tablet Take 2.5 mg by mouth daily.    valACYclovir (VALTREX) 500 MG tablet Take 500 mg by mouth daily.    Vitamin D,  Ergocalciferol, (DRISDOL) 1.25 MG (50000 UNIT) CAPS capsule Take by mouth.    [DISCONTINUED] citalopram (CELEXA) 10 MG tablet Take 10 mg by mouth daily.    [DISCONTINUED] pantoprazole (PROTONIX) 40 MG tablet Take 40 mg by mouth daily.    pantoprazole (PROTONIX) 40 MG tablet Take 1 tablet (40 mg total) by mouth daily.    No facility-administered encounter medications on file as of 05/03/2023.    Surgical History: Past Surgical History:  Procedure Laterality Date   CERVICAL CERCLAGE     COLONOSCOPY     ESOPHAGOGASTRODUODENOSCOPY (EGD) WITH PROPOFOL N/A 12/07/2019   Procedure: ESOPHAGOGASTRODUODENOSCOPY (EGD) WITH PROPOFOL;  Surgeon: Regis Bill, MD;  Location: [facility] ENDOSCOPY;  Service: Endoscopy;  Laterality: N/A;   TUBAL LIGATION      Medical History: Past Medical History:  Diagnosis Date   Diabetes mellitus without complication (HCC)    GERD (gastroesophageal reflux disease)    Goiter    Hyperlipidemia    Vitamin deficiency     Family History: Family History  Problem Relation Age of Onset   Diabetes Mother    Hypertension Mother    Vitamin D deficiency Mother    Breast cancer Mother    Hypertension Father    Diabetes Father    Breast cancer Maternal Grandmother     Social History   Socioeconomic History   Marital status: Single    Spouse name: Not on file   Number of children: Not on  file   Years of education: Not on file   Highest education level: Not on file  Occupational History   Not on file  Tobacco Use   Smoking status: Every Day    Current packs/day: 1.00    Average packs/day: 1 pack/day for 9.0 years (9.0 ttl pk-yrs)    Types: Cigarettes   Smokeless tobacco: Never  Vaping Use   Vaping status: Former  Substance and Sexual Activity   Alcohol use: Yes    Alcohol/week: 3.0 standard drinks of alcohol    Types: 3 Shots of liquor per week    Comment: occ.   Drug use: Never   Sexual activity: Not on file  Other Topics Concern   Not on file   Social History Narrative   Not on file   Social Drivers of Health   Financial Resource Strain: Not on file  Food Insecurity: Not on file  Transportation Needs: Not on file  Physical Activity: Not on file  Stress: Not on file  Social Connections: Not on file  Intimate Partner Violence: Not on file      Review of Systems  Constitutional:  Negative for chills, fatigue and unexpected weight change.  HENT:  Negative for congestion, postnasal drip, rhinorrhea, sneezing and sore throat.   Eyes:  Negative for redness.  Respiratory:  Negative for cough, chest tightness, shortness of breath and wheezing.   Cardiovascular: Negative.  Negative for chest pain and palpitations.  Gastrointestinal:  Positive for abdominal distention. Negative for abdominal pain, constipation, diarrhea, nausea and vomiting.       Delayed gastric emptying  Genitourinary:  Negative for dysuria and frequency.  Musculoskeletal:  Negative for arthralgias, back pain, joint swelling and neck pain.  Skin:  Negative for rash.  Neurological: Negative.  Negative for tremors and numbness.  Hematological:  Negative for adenopathy. Does not bruise/bleed easily.  Psychiatric/Behavioral:  Negative for behavioral problems (Depression), sleep disturbance and suicidal ideas. The patient is not nervous/anxious.     Vital Signs: BP 110/78   Pulse (!) 102   Temp 98.4 F (36.9 C)   Resp 16   Ht 5\' 6"  (1.676 m)   Wt 165 lb 9.6 oz (75.1 kg)   SpO2 97%   BMI 26.73 kg/m    Physical Exam Vitals reviewed.  Constitutional:      General: She is awake. She is not in acute distress.    Appearance: Normal appearance. She is well-developed, well-groomed and overweight. She is not ill-appearing or diaphoretic.  HENT:     Head: Normocephalic and atraumatic.     Right Ear: Tympanic membrane, ear canal and external ear normal. There is no impacted cerumen.     Left Ear: Tympanic membrane, ear canal and external ear normal. There is  no impacted cerumen.     Nose: Nose normal. No congestion or rhinorrhea.     Mouth/Throat:     Mouth: Mucous membranes are moist.     Pharynx: Oropharynx is clear. No oropharyngeal exudate or posterior oropharyngeal erythema.  Eyes:     General: Lids are normal. Vision grossly intact. Gaze aligned appropriately. No scleral icterus.       Right eye: No discharge.        Left eye: No discharge.     Extraocular Movements: Extraocular movements intact.     Conjunctiva/sclera: Conjunctivae normal.     Pupils: Pupils are equal, round, and reactive to light.  Neck:     Thyroid: No thyromegaly.  Vascular: No JVD.     Trachea: No tracheal deviation.  Cardiovascular:     Rate and Rhythm: Normal rate and regular rhythm.     Pulses: Normal pulses.     Heart sounds: Normal heart sounds, S1 normal and S2 normal. No murmur heard.    No friction rub. No gallop.  Pulmonary:     Effort: Pulmonary effort is normal. No accessory muscle usage or respiratory distress.     Breath sounds: Normal breath sounds and air entry. No stridor. No wheezing or rales.  Chest:     Chest wall: No tenderness.  Abdominal:     General: Bowel sounds are normal. There is no distension.     Palpations: Abdomen is soft. There is no mass.     Tenderness: There is no abdominal tenderness. There is no guarding or rebound.  Musculoskeletal:        General: No tenderness or deformity. Normal range of motion.     Cervical back: Normal range of motion and neck supple.     Right lower leg: No edema.     Left lower leg: No edema.  Lymphadenopathy:     Cervical: No cervical adenopathy.  Skin:    General: Skin is warm and dry.     Capillary Refill: Capillary refill takes less than 2 seconds.     Coloration: Skin is not pale.     Findings: No erythema or rash.  Neurological:     Mental Status: She is alert and oriented to person, place, and time.     Cranial Nerves: No cranial nerve deficit.     Motor: No abnormal muscle  tone.     Coordination: Coordination normal.     Gait: Gait normal.     Deep Tendon Reflexes: Reflexes are normal and symmetric.  Psychiatric:        Mood and Affect: Mood normal.        Behavior: Behavior normal. Behavior is cooperative.        Thought Content: Thought content normal.        Judgment: Judgment normal.        Assessment/Plan: 1. Encounter for routine adult health examination with abnormal findings (Primary) Age-appropriate preventive screenings and vaccinations discussed, annual physical exam completed. Routine labs for health maintenance ordered, see below. PHM updated.    2. Gastroesophageal reflux disease without esophagitis Continue pantoprazole as prescribed  - pantoprazole (PROTONIX) 40 MG tablet; Take 1 tablet (40 mg total) by mouth daily.  Dispense: 90 tablet; Refill: 1  3. Chronic hand pain, left Xray of left hand ordered  - DG Hand Complete Left; Future  4. Vitamin D deficiency Routine labs ordered  - Vitamin D (25 hydroxy)  5. Screening for colorectal cancer Cologuard test ordered  - Cologuard    General Counseling: Michele Clay verbalizes understanding of the findings of todays visit and agrees with plan of treatment. I have discussed any further diagnostic evaluation that may be needed or ordered today. We also reviewed her medications today. she has been encouraged to call the office with any questions or concerns that should arise related to todays visit.    Orders Placed This Encounter  Procedures   DG Hand Complete Left   Cologuard   Vitamin D (25 hydroxy)    Meds ordered this encounter  Medications   pantoprazole (PROTONIX) 40 MG tablet    Sig: Take 1 tablet (40 mg total) by mouth daily.    Dispense:  90 tablet  Refill:  1    Return in about 1 year (around 05/02/2024) for CPE, Michele Clay PCP and otherwise as needed. .   Total time spent:30 Minutes Time spent includes review of chart, medications, test results, and follow up plan  with the patient.   [place] Controlled Substance Database was reviewed by me.  This patient was seen by Sallyanne Kuster, FNP-C in collaboration with Dr. Beverely Risen as a part of collaborative care agreement.  Michele Clay R. Tedd Sias, MSN, FNP-C Internal medicine

## 2023-05-09 ENCOUNTER — Telehealth: Payer: Self-pay | Admitting: Nurse Practitioner

## 2023-05-09 NOTE — Telephone Encounter (Signed)
 Emailed letter regarding airlines to patient. Scanned-Toni

## 2023-05-24 LAB — COLOGUARD: COLOGUARD: NEGATIVE

## 2023-05-25 NOTE — Progress Notes (Signed)
 Cologuard test is negative, repeat test in 3 years.

## 2023-05-27 ENCOUNTER — Telehealth: Payer: Self-pay

## 2023-05-27 NOTE — Telephone Encounter (Signed)
-----   Message from [facility] sent at 05/25/2023  2:12 PM EDT ----- Cologuard test is negative, repeat test in 3 years.

## 2023-05-27 NOTE — Telephone Encounter (Signed)
 Patient notified

## 2023-12-01 ENCOUNTER — Ambulatory Visit
Admission: RE | Admit: 2023-12-01 | Discharge: 2023-12-01 | Disposition: A | Discharge status: Home / Self Care | Attending: Nurse Practitioner | Admitting: Nurse Practitioner

## 2023-12-01 ENCOUNTER — Encounter: Payer: Self-pay | Admitting: Nurse Practitioner

## 2023-12-01 ENCOUNTER — Ambulatory Visit
Admission: RE | Admit: 2023-12-01 | Discharge: 2023-12-01 | Disposition: A | Discharge status: Home / Self Care | Source: Ambulatory Visit | Attending: Nurse Practitioner | Admitting: Nurse Practitioner

## 2023-12-01 ENCOUNTER — Ambulatory Visit: Admitting: Nurse Practitioner

## 2023-12-01 VITALS — BP 114/72 | HR 100 | Temp 96.9°F | Resp 16 | Ht 66.0 in | Wt 173.2 lb

## 2023-12-01 DIAGNOSIS — M79642 Pain in left hand: Secondary | ICD-10-CM | POA: Diagnosis present

## 2023-12-01 DIAGNOSIS — G8929 Other chronic pain: Secondary | ICD-10-CM | POA: Diagnosis present

## 2023-12-01 DIAGNOSIS — R319 Hematuria, unspecified: Secondary | ICD-10-CM

## 2023-12-01 DIAGNOSIS — K219 Gastro-esophageal reflux disease without esophagitis: Secondary | ICD-10-CM

## 2023-12-01 DIAGNOSIS — Z794 Long term (current) use of insulin: Secondary | ICD-10-CM

## 2023-12-01 DIAGNOSIS — N390 Urinary tract infection, site not specified: Secondary | ICD-10-CM

## 2023-12-01 DIAGNOSIS — R300 Dysuria: Secondary | ICD-10-CM

## 2023-12-01 DIAGNOSIS — E1165 Type 2 diabetes mellitus with hyperglycemia: Secondary | ICD-10-CM | POA: Diagnosis not present

## 2023-12-01 DIAGNOSIS — J301 Allergic rhinitis due to pollen: Secondary | ICD-10-CM

## 2023-12-01 DIAGNOSIS — R051 Acute cough: Secondary | ICD-10-CM

## 2023-12-01 DIAGNOSIS — B3731 Acute candidiasis of vulva and vagina: Secondary | ICD-10-CM | POA: Diagnosis not present

## 2023-12-01 LAB — ESTIMATED GFR: EGFR: 91

## 2023-12-01 LAB — POCT URINALYSIS DIPSTICK
Bilirubin, UA: NEGATIVE
Glucose, UA: POSITIVE — AB
Leukocytes, UA: NEGATIVE
Nitrite, UA: NEGATIVE
Protein, UA: POSITIVE — AB
Spec Grav, UA: 1.02 (ref 1.010–1.025)
Urobilinogen, UA: 0.2 U/dL
pH, UA: 5 (ref 5.0–8.0)

## 2023-12-01 LAB — MICROALBUMIN / CREATININE URINE RATIO
Albumin, Ur: 7.4 mg/dL
Albumin/Creatinine Ratio, Urine, POC: 96
Creatinine,Ur: 77.1

## 2023-12-01 LAB — HEMOGLOBIN A1C: Hemoglobin A1C: 9.3

## 2023-12-01 LAB — CREATININE: Creatinine: 0.8 (ref 0.5–1.1)

## 2023-12-01 MED ORDER — DEXLANSOPRAZOLE 60 MG PO CPDR: 60.0000 mg | DELAYED_RELEASE_CAPSULE | Freq: Every day | ORAL | 5 refills | Status: AC

## 2023-12-01 MED ORDER — FLUCONAZOLE 150 MG PO TABS: 150.0000 mg | ORAL_TABLET | Freq: Once | ORAL | 3 refills | Status: AC

## 2023-12-01 MED ORDER — PROMETHAZINE-DM 6.25-15 MG/5ML PO SYRP: 5.0000 mL | ORAL_SOLUTION | Freq: Four times a day (QID) | ORAL | 1 refills | Status: AC | PRN

## 2023-12-01 MED ORDER — SULFAMETHOXAZOLE-TRIMETHOPRIM 800-160 MG PO TABS: 1.0000 | ORAL_TABLET | Freq: Two times a day (BID) | ORAL | 0 refills | Status: AC

## 2023-12-01 MED ORDER — AZELASTINE HCL 0.1 % NA SOLN: 1.0000 | Freq: Two times a day (BID) | NASAL | 12 refills | Status: AC

## 2023-12-01 NOTE — Progress Notes (Signed)
 [facility] [address] [place], [place] [address]  Internal MEDICINE  Office Visit Note  Patient Name: Michele Clay  [id number]  [id number]  Date of Service: 12/01/2023  Chief Complaint  Patient presents with   Diabetes   Gastroesophageal Reflux   Hyperlipidemia   Follow-up    Coughing at night, poss uti and gastro meds    HPI Michele Clay presents for a follow-up visit for possible UTI, diabetes, GERD, cough.  Diabetes -- switched Endo to [facility]. Last A1c is 9.3. her tresiba dose was increased and she is being started on mounjaro which she has to pick up from the pharmacy.  GERD -- pantoprazole  is not helping  UTI -- symptoms started several days ago. Reports urgency, frequency, burning with urination, urinary discomfort, back pain. Urinalysis positive fro moderate blood in urine. Negative for nitrites and leukocytes.  Acute cough and sinus drainage -- may need additional medication for cough suppressant and allergy medication.   Current Medication: Outpatient Encounter Medications as of 12/01/2023  Medication Sig Note   azelastine (ASTELIN) 0.1 % nasal spray Place 1 spray into both nostrils 2 (two) times daily. Use in each nostril as directed    dexlansoprazole (DEXILANT) 60 MG capsule Take 1 capsule (60 mg total) by mouth daily.    fluconazole (DIFLUCAN) 150 MG tablet Take 1 tablet (150 mg total) by mouth once for 1 dose. May take an additional dose after 3 days if still symptomatic.    promethazine-dextromethorphan (PROMETHAZINE-DM) 6.25-15 MG/5ML syrup Take 5 mLs by mouth 4 (four) times daily as needed for cough.    sulfamethoxazole-trimethoprim (BACTRIM DS) 800-160 MG tablet Take 1 tablet by mouth 2 (two) times daily for 5 days. Take with food    tirzepatide (MOUNJARO) 2.5 MG/0.5ML Pen Inject 2.5 mg into the skin once a week.    atorvastatin (LIPITOR) 20 MG tablet Take 20 mg by mouth daily.    Continuous Glucose Sensor (DEXCOM G7 SENSOR) MISC USE 1 EACH EVERY 10  (TEN) DAYS    empagliflozin (JARDIANCE) 25 MG TABS tablet Take by mouth daily.    insulin  degludec (TRESIBA) 200 UNIT/ML FlexTouch Pen Inject 100 Units into the skin daily. 12/07/2019: 92 units taken    insulin  lispro (HUMALOG) 100 UNIT/ML KwikPen Inject 25 to 35 units under the skin three times daily before meals as directed by your doctor. Maximum daily dose is 105 units. Appointment needed for further refills [phone].    lisinopril (ZESTRIL) 2.5 MG tablet Take 2.5 mg by mouth daily.    valACYclovir (VALTREX) 500 MG tablet Take 500 mg by mouth daily.    Vitamin D, Ergocalciferol, (DRISDOL) 1.25 MG (50000 UNIT) CAPS capsule Take by mouth.    [DISCONTINUED] fluconazole (DIFLUCAN) 200 MG tablet Take by mouth.    [DISCONTINUED] pantoprazole  (PROTONIX ) 40 MG tablet Take 1 tablet (40 mg total) by mouth daily.    No facility-administered encounter medications on file as of 12/01/2023.    Surgical History: Past Surgical History:  Procedure Laterality Date   CERVICAL CERCLAGE     COLONOSCOPY     ESOPHAGOGASTRODUODENOSCOPY (EGD) WITH PROPOFOL  N/A 12/07/2019   Procedure: ESOPHAGOGASTRODUODENOSCOPY (EGD) WITH PROPOFOL ;  Surgeon: Maryruth Ole DASEN, MD;  Location: [facility] ENDOSCOPY;  Service: Endoscopy;  Laterality: N/A;   TUBAL LIGATION      Medical History: Past Medical History:  Diagnosis Date   Diabetes mellitus without complication (HCC)    GERD (gastroesophageal reflux disease)    Goiter    Hyperlipidemia  Vitamin deficiency     Family History: Family History  Problem Relation Age of Onset   Diabetes Mother    Hypertension Mother    Vitamin D deficiency Mother    Breast cancer Mother    Hypertension Father    Diabetes Father    Breast cancer Maternal Grandmother     Social History   Socioeconomic History   Marital status: Single    Spouse name: Not on file   Number of children: Not on file   Years of education: Not on file   Highest education level: Not on file   Occupational History   Not on file  Tobacco Use   Smoking status: Every Day    Current packs/day: 1.00    Average packs/day: 1 pack/day for 9.0 years (9.0 ttl pk-yrs)    Types: Cigarettes   Smokeless tobacco: Never  Vaping Use   Vaping status: Former  Substance and Sexual Activity   Alcohol use: Yes    Alcohol/week: 3.0 standard drinks of alcohol    Types: 3 Shots of liquor per week    Comment: occ.   Drug use: Never   Sexual activity: Not on file  Other Topics Concern   Not on file  Social History Narrative   Not on file   Social Drivers of Health   Financial Resource Strain: Not on file  Food Insecurity: Not on file  Transportation Needs: Not on file  Physical Activity: Not on file  Stress: Not on file  Social Connections: Not on file  Intimate Partner Violence: Not on file      Review of Systems  Constitutional:  Positive for fatigue.  HENT:  Positive for congestion, postnasal drip, rhinorrhea and sneezing.   Respiratory: Negative.  Negative for cough, chest tightness, shortness of breath and wheezing.   Cardiovascular: Negative.  Negative for chest pain and palpitations.  Gastrointestinal: Negative.   Genitourinary:  Positive for dysuria, flank pain, frequency, hematuria, pelvic pain and urgency.  Musculoskeletal:  Positive for arthralgias.    Vital Signs: BP 114/72   Pulse 100   Temp (!) 96.9 F (36.1 C)   Resp 16   Ht 5' 6 (1.676 m)   Wt 173 lb 3.2 oz (78.6 kg)   SpO2 98%   BMI 27.96 kg/m    Physical Exam Vitals reviewed.  Constitutional:      General: She is not in acute distress.    Appearance: Normal appearance. She is not ill-appearing.  HENT:     Head: Normocephalic and atraumatic.  Eyes:     Pupils: Pupils are equal, round, and reactive to light.  Cardiovascular:     Rate and Rhythm: Normal rate and regular rhythm.  Pulmonary:     Effort: Pulmonary effort is normal. No respiratory distress.  Abdominal:     Tenderness: There is  abdominal tenderness in the suprapubic area.  Skin:    General: Skin is warm and dry.     Capillary Refill: Capillary refill takes less than 2 seconds.  Neurological:     Mental Status: She is alert and oriented to person, place, and time.  Psychiatric:        Mood and Affect: Mood normal.        Behavior: Behavior normal.        Assessment/Plan: 1. Urinary tract infection with hematuria, site unspecified (Primary) Urine culture sent to lab. Bactrim prescribed. Take until gone  - CULTURE, URINE COMPREHENSIVE - sulfamethoxazole-trimethoprim (BACTRIM DS) 800-160 MG tablet; Take  1 tablet by mouth 2 (two) times daily for 5 days. Take with food  Dispense: 10 tablet; Refill: 0  2. Type 2 diabetes mellitus with hyperglycemia, with long-term current use of insulin  (HCC) Followed by [facility] endocrinology and she was recently started on mounjaro which she still needs to pick up from the pharmacy.  - tirzepatide (MOUNJARO) 2.5 MG/0.5ML Pen; Inject 2.5 mg into the skin once a week.  3. Vulvovaginal candidiasis Fluconazole prescribed  - fluconazole (DIFLUCAN) 150 MG tablet; Take 1 tablet (150 mg total) by mouth once for 1 dose. May take an additional dose after 3 days if still symptomatic.  Dispense: 3 tablet; Refill: 3  4. Gastroesophageal reflux disease without esophagitis Discontinue pantoprazole  and start dexlansoprazole as prescribed  - dexlansoprazole (DEXILANT) 60 MG capsule; Take 1 capsule (60 mg total) by mouth daily.  Dispense: 30 capsule; Refill: 5  5. Non-seasonal allergic rhinitis due to pollen Nasal spray prescribed  - azelastine (ASTELIN) 0.1 % nasal spray; Place 1 spray into both nostrils 2 (two) times daily. Use in each nostril as directed  Dispense: 30 mL; Refill: 12  6. Acute cough Cough syrup prescribed  - promethazine-dextromethorphan (PROMETHAZINE-DM) 6.25-15 MG/5ML syrup; Take 5 mLs by mouth 4 (four) times daily as needed for cough.  Dispense: 180 mL; Refill: 1  7.  Dysuria Urinalysis is abnormal and urine culture was sent to lab - POCT urinalysis dipstick   General Counseling: Michele Clay verbalizes understanding of the findings of todays visit and agrees with plan of treatment. I have discussed any further diagnostic evaluation that may be needed or ordered today. We also reviewed her medications today. she has been encouraged to call the office with any questions or concerns that should arise related to todays visit.    Orders Placed This Encounter  Procedures   CULTURE, URINE COMPREHENSIVE   POCT urinalysis dipstick    Meds ordered this encounter  Medications   fluconazole (DIFLUCAN) 150 MG tablet    Sig: Take 1 tablet (150 mg total) by mouth once for 1 dose. May take an additional dose after 3 days if still symptomatic.    Dispense:  3 tablet    Refill:  3    Fill new script today   dexlansoprazole (DEXILANT) 60 MG capsule    Sig: Take 1 capsule (60 mg total) by mouth daily.    Dispense:  30 capsule    Refill:  5    Fill new script today, discontinue pantoprazole    promethazine-dextromethorphan (PROMETHAZINE-DM) 6.25-15 MG/5ML syrup    Sig: Take 5 mLs by mouth 4 (four) times daily as needed for cough.    Dispense:  180 mL    Refill:  1    Fill new script today   azelastine (ASTELIN) 0.1 % nasal spray    Sig: Place 1 spray into both nostrils 2 (two) times daily. Use in each nostril as directed    Dispense:  30 mL    Refill:  12    Fill new script today.   sulfamethoxazole-trimethoprim (BACTRIM DS) 800-160 MG tablet    Sig: Take 1 tablet by mouth 2 (two) times daily for 5 days. Take with food    Dispense:  10 tablet    Refill:  0    Fill new script today    Return if symptoms worsen or fail to improve.   Total time spent:30 Minutes Time spent includes review of chart, medications, test results, and follow up plan with the patient.   [place]  Controlled Substance Database was reviewed by me.  This patient was seen by Mardy Maxin,  FNP-C in collaboration with Dr. Sigrid Bathe as a part of collaborative care agreement.   Michele Delph R. Maxin, MSN, FNP-C Internal medicine

## 2023-12-06 ENCOUNTER — Encounter: Payer: Self-pay | Admitting: Nurse Practitioner

## 2023-12-07 ENCOUNTER — Ambulatory Visit: Payer: Self-pay | Admitting: Nurse Practitioner

## 2023-12-07 LAB — CULTURE, URINE COMPREHENSIVE

## 2023-12-15 NOTE — Telephone Encounter (Signed)
-----   Message from Mardy Maxin sent at 12/08/2023  3:05 PM EDT ----- Urine culture was also normal ----- Message ----- From: Donelda Jain, CMA Sent: 12/07/2023   1:21 PM EDT To: Mardy Maxin, NP  Spoke to patient about her xray and then she asked about her urine culture results can you please look at that?  ----- Message ----- From: Maxin Mardy, NP Sent: 12/07/2023   7:09 AM EDT To: Jain Donelda, CMA  Xray is normal. No abnormalities to explain her pain can be seen.  ----- Message ----- From: Interface, Rad Results In Sent: 12/06/2023   6:32 PM EDT To: Mardy Maxin, NP

## 2023-12-15 NOTE — Telephone Encounter (Signed)
 Patient notified

## 2023-12-15 NOTE — Telephone Encounter (Signed)
Left patient a message to give office a callback.Michele Clay

## 2024-02-21 ENCOUNTER — Ambulatory Visit: Admitting: Nurse Practitioner

## 2024-02-21 ENCOUNTER — Encounter: Payer: Self-pay | Admitting: Nurse Practitioner

## 2024-02-21 VITALS — BP 110/68 | HR 60 | Temp 96.0°F | Resp 16 | Ht 66.0 in | Wt 186.6 lb

## 2024-02-21 DIAGNOSIS — S99921A Unspecified injury of right foot, initial encounter: Secondary | ICD-10-CM

## 2024-02-21 DIAGNOSIS — M79674 Pain in right toe(s): Secondary | ICD-10-CM | POA: Diagnosis not present

## 2024-02-21 NOTE — Progress Notes (Signed)
 [facility] [address] [place], [place] [address]  Internal MEDICINE  Office Visit Note  Patient Name: Michele Clay  [id number]  [id number]  Date of Service: 02/21/2024  Chief Complaint  Patient presents with   Acute Visit    Slip down last two steps and bend big toe on right foot pain     HPI Michele Clay presents for an acute sick visit for right great toe pain  --onset of right toe pain was 6 days ago.  Patient recently came back from a cruise and was still a little wobbly on her legs when walking. Patient fell going down stairs and injured her right great toe.  The right great toe is bruised and swollen.  Taking ibuprofen OTC as needed, which is helping and pain is tolerable.    Current Medication:  Outpatient Encounter Medications as of 02/21/2024  Medication Sig Note   atorvastatin (LIPITOR) 20 MG tablet Take 20 mg by mouth daily.    azelastine  (ASTELIN ) 0.1 % nasal spray Place 1 spray into both nostrils 2 (two) times daily. Use in each nostril as directed    Continuous Glucose Sensor (DEXCOM G7 SENSOR) MISC USE 1 EACH EVERY 10 (TEN) DAYS    dexlansoprazole  (DEXILANT ) 60 MG capsule Take 1 capsule (60 mg total) by mouth daily.    empagliflozin (JARDIANCE) 25 MG TABS tablet Take by mouth daily.    insulin  degludec (TRESIBA) 200 UNIT/ML FlexTouch Pen Inject 100 Units into the skin daily. 12/07/2019: 92 units taken    insulin  lispro (HUMALOG) 100 UNIT/ML KwikPen Inject 25 to 35 units under the skin three times daily before meals as directed by your doctor. Maximum daily dose is 105 units. Appointment needed for further refills [phone].    lisinopril (ZESTRIL) 2.5 MG tablet Take 2.5 mg by mouth daily.    promethazine -dextromethorphan (PROMETHAZINE -DM) 6.25-15 MG/5ML syrup Take 5 mLs by mouth 4 (four) times daily as needed for cough.    tirzepatide (MOUNJARO) 2.5 MG/0.5ML Pen Inject 2.5 mg into the skin once a week.    valACYclovir (VALTREX) 500 MG tablet  Take 500 mg by mouth daily.    Vitamin D, Ergocalciferol, (DRISDOL) 1.25 MG (50000 UNIT) CAPS capsule Take by mouth.    No facility-administered encounter medications on file as of 02/21/2024.      Medical History: Past Medical History:  Diagnosis Date   Diabetes mellitus without complication (HCC)    GERD (gastroesophageal reflux disease)    Goiter    Hyperlipidemia    Vitamin deficiency      Vital Signs: BP 110/68   Pulse 60   Temp (!) 96 F (35.6 C)   Resp 16   Ht 5' 6 (1.676 m)   Wt 186 lb 9.6 oz (84.6 kg)   SpO2 98%   BMI 30.12 kg/m    Review of Systems  Constitutional: Negative.  Negative for fatigue.  Respiratory: Negative.  Negative for cough, chest tightness, shortness of breath and wheezing.   Cardiovascular: Negative.  Negative for chest pain and palpitations.  Gastrointestinal: Negative.   Genitourinary: Negative.   Musculoskeletal:        Right great toe pain, bruising and swelling.  Hematological: Negative.     Physical Exam Vitals reviewed.  Constitutional:      General: She is not in acute distress.    Appearance: Normal appearance. She is obese. She is not ill-appearing.  HENT:     Head: Normocephalic and atraumatic.  Eyes:  Pupils: Pupils are equal, round, and reactive to light.  Cardiovascular:     Rate and Rhythm: Normal rate and regular rhythm.  Pulmonary:     Effort: Pulmonary effort is normal. No respiratory distress.  Musculoskeletal:     Right foot: Decreased range of motion. Swelling (right great toe, with bruising and pain.) and tenderness present.     Left foot: Decreased range of motion.  Neurological:     Mental Status: She is alert and oriented to person, place, and time.  Psychiatric:        Mood and Affect: Mood normal.        Behavior: Behavior normal.       Assessment/Plan: 1. Great toe pain, right (Primary) Xray ordered to rule out fracture, will order orthopedic referral to [facility] pending results -  DG Foot Complete Right; Future  2. Injury of right great toe, initial encounter Xray ordered to rule out fracture, will order orthopedic referral to [facility] pending results - DG Foot Complete Right; Future   General Counseling: Michele Clay verbalizes understanding of the findings of todays visit and agrees with plan of treatment. I have discussed any further diagnostic evaluation that may be needed or ordered today. We also reviewed her medications today. she has been encouraged to call the office with any questions or concerns that should arise related to todays visit.    Counseling:    Orders Placed This Encounter  Procedures   DG Foot Complete Right    No orders of the defined types were placed in this encounter.   Return if symptoms worsen or fail to improve, for will do referral to orthopedic pending xray results, send xray order to [facility] imaging stat.  [place] Controlled Substance Database was reviewed by me for overdose risk score (ORS)  Time spent:20 Minutes Time spent with patient included reviewing progress notes, labs, imaging studies, and discussing plan for follow up.   This patient was seen by Mardy Maxin, FNP-C in collaboration with Dr. Sigrid Bathe as a part of collaborative care agreement.  Ghada Abbett R. Maxin, MSN, FNP-C Internal Medicine

## 2024-02-22 ENCOUNTER — Telehealth: Payer: Self-pay | Admitting: Nurse Practitioner

## 2024-02-22 NOTE — Telephone Encounter (Signed)
 Patient called stating toe x-ray show a break. I told her she can do a walk in at [facility]. If anything needed from us , to just call-Toni

## 2024-05-03 ENCOUNTER — Encounter: Admitting: Nurse Practitioner
